# Patient Record
Sex: Female | Born: 1940 | Race: White | Hispanic: No | Smoking: Never smoker
Health system: Southern US, Community
[De-identification: ages and names within clinical notes are randomized; demographics above are authoritative.]

## PROBLEM LIST (undated history)

## (undated) DIAGNOSIS — E785 Hyperlipidemia, unspecified: Secondary | ICD-10-CM

## (undated) DIAGNOSIS — J189 Pneumonia, unspecified organism: Secondary | ICD-10-CM

## (undated) DIAGNOSIS — F419 Anxiety disorder, unspecified: Secondary | ICD-10-CM

## (undated) DIAGNOSIS — K5730 Diverticulosis of large intestine without perforation or abscess without bleeding: Secondary | ICD-10-CM

## (undated) DIAGNOSIS — E559 Vitamin D deficiency, unspecified: Secondary | ICD-10-CM

## (undated) DIAGNOSIS — K8020 Calculus of gallbladder without cholecystitis without obstruction: Secondary | ICD-10-CM

## (undated) HISTORY — PX: ABDOMINAL HYSTERECTOMY: SHX81

## (undated) HISTORY — DX: Calculus of gallbladder without cholecystitis without obstruction: K80.20

## (undated) HISTORY — DX: Pneumonia, unspecified organism: J18.9

## (undated) HISTORY — DX: Anxiety disorder, unspecified: F41.9

## (undated) HISTORY — DX: Diverticulosis of large intestine without perforation or abscess without bleeding: K57.30

## (undated) HISTORY — DX: Hyperlipidemia, unspecified: E78.5

## (undated) HISTORY — DX: Vitamin D deficiency, unspecified: E55.9

## (undated) HISTORY — PX: VESICOVAGINAL FISTULA CLOSURE W/ TAH: SUR271

## (undated) HISTORY — PX: CHOLECYSTECTOMY: SHX55

## (undated) HISTORY — PX: TONSILLECTOMY: SUR1361

## (undated) HISTORY — PX: APPENDECTOMY: SHX54

---

## 2000-05-23 ENCOUNTER — Encounter: Admission: RE | Admit: 2000-05-23 | Discharge: 2000-05-23 | Payer: Self-pay | Admitting: Endocrinology

## 2000-05-23 ENCOUNTER — Encounter: Payer: Self-pay | Admitting: Endocrinology

## 2002-07-10 ENCOUNTER — Encounter: Admission: RE | Admit: 2002-07-10 | Discharge: 2002-07-10 | Payer: Self-pay | Admitting: Endocrinology

## 2002-07-10 ENCOUNTER — Encounter: Payer: Self-pay | Admitting: Endocrinology

## 2003-04-30 ENCOUNTER — Ambulatory Visit (HOSPITAL_COMMUNITY): Admission: RE | Admit: 2003-04-30 | Discharge: 2003-04-30 | Payer: Self-pay | Admitting: Family Medicine

## 2003-05-03 ENCOUNTER — Ambulatory Visit (HOSPITAL_COMMUNITY): Admission: RE | Admit: 2003-05-03 | Discharge: 2003-05-03 | Payer: Self-pay | Admitting: Family Medicine

## 2003-05-03 ENCOUNTER — Encounter: Payer: Self-pay | Admitting: Family Medicine

## 2005-03-28 ENCOUNTER — Encounter: Admission: RE | Admit: 2005-03-28 | Discharge: 2005-03-28 | Payer: Self-pay | Admitting: Family Medicine

## 2006-04-02 ENCOUNTER — Encounter: Admission: RE | Admit: 2006-04-02 | Discharge: 2006-04-02 | Payer: Self-pay | Admitting: Family Medicine

## 2007-06-11 ENCOUNTER — Encounter: Admission: RE | Admit: 2007-06-11 | Discharge: 2007-06-11 | Payer: Self-pay | Admitting: Family Medicine

## 2008-07-06 ENCOUNTER — Encounter: Admission: RE | Admit: 2008-07-06 | Discharge: 2008-07-06 | Payer: Self-pay | Admitting: Family Medicine

## 2009-07-11 ENCOUNTER — Encounter: Admission: RE | Admit: 2009-07-11 | Discharge: 2009-07-11 | Payer: Self-pay | Admitting: Family Medicine

## 2010-09-11 ENCOUNTER — Encounter: Admission: RE | Admit: 2010-09-11 | Discharge: 2010-09-11 | Payer: Self-pay | Admitting: Family Medicine

## 2011-01-21 ENCOUNTER — Encounter: Payer: Self-pay | Admitting: Endocrinology

## 2011-03-01 DIAGNOSIS — R079 Chest pain, unspecified: Secondary | ICD-10-CM | POA: Insufficient documentation

## 2011-03-01 DIAGNOSIS — K5730 Diverticulosis of large intestine without perforation or abscess without bleeding: Secondary | ICD-10-CM | POA: Insufficient documentation

## 2011-03-02 ENCOUNTER — Encounter (INDEPENDENT_AMBULATORY_CARE_PROVIDER_SITE_OTHER): Payer: Medicare Other | Admitting: Physician Assistant

## 2011-03-02 ENCOUNTER — Encounter: Payer: Self-pay | Admitting: Physician Assistant

## 2011-03-02 ENCOUNTER — Encounter (INDEPENDENT_AMBULATORY_CARE_PROVIDER_SITE_OTHER): Payer: Self-pay | Admitting: *Deleted

## 2011-03-02 DIAGNOSIS — R9439 Abnormal result of other cardiovascular function study: Secondary | ICD-10-CM

## 2011-03-02 DIAGNOSIS — R079 Chest pain, unspecified: Secondary | ICD-10-CM

## 2011-03-08 NOTE — Letter (Signed)
Summary: Generic Letter  Architectural technologist, Main Office  [address]   [place], [place] [address]   Phone: [phone]  Fax: [phone]    03/02/2011         [facility] [address] [place], [place]  [address]  [place]  Dear Ms. Yeater,    We need to schedule you for a Stress test myoview within the next 2   weeks as per Tereso Newcomer, PA-C/[place] (DOD)today, pt's primary   Cardiologist is Dr. Laurene Footman.       Sincerely,   Danielle Rankin, CMA

## 2011-03-13 ENCOUNTER — Telehealth (HOSPITAL_COMMUNITY): Payer: Self-pay | Admitting: Radiology

## 2011-03-14 ENCOUNTER — Encounter (INDEPENDENT_AMBULATORY_CARE_PROVIDER_SITE_OTHER): Payer: Self-pay | Admitting: *Deleted

## 2011-03-14 ENCOUNTER — Encounter: Payer: Self-pay | Admitting: Cardiology

## 2011-03-14 ENCOUNTER — Encounter: Payer: Self-pay | Admitting: *Deleted

## 2011-03-14 ENCOUNTER — Ambulatory Visit (HOSPITAL_COMMUNITY): Payer: Medicare Other | Attending: Cardiology

## 2011-03-14 DIAGNOSIS — I4949 Other premature depolarization: Secondary | ICD-10-CM

## 2011-03-14 DIAGNOSIS — I491 Atrial premature depolarization: Secondary | ICD-10-CM

## 2011-03-14 DIAGNOSIS — R9430 Abnormal result of cardiovascular function study, unspecified: Secondary | ICD-10-CM

## 2011-03-14 DIAGNOSIS — R079 Chest pain, unspecified: Secondary | ICD-10-CM | POA: Insufficient documentation

## 2011-03-16 ENCOUNTER — Telehealth: Payer: Self-pay | Admitting: Physician Assistant

## 2011-03-20 NOTE — Progress Notes (Signed)
Summary: pt rtn carol's call  Phone Note Call from Patient   Caller: Patient [phone] Reason for Call: Talk to Nurse Summary of Call: pt rtn call to carol Initial call taken by: Glynda Jaeger,  March 16, 2011 11:11 AM  Follow-up for Phone Call        pt ware of results Danielle Rankin, CMA  March 16, 2011 5:12 PM

## 2011-03-20 NOTE — Assessment & Plan Note (Signed)
Summary: Cardiology Nuclear Testing  Nuclear Med Background Indications for Stress Test: Evaluation for Ischemia   History: GXT  History Comments: 03/02/11 GXT:BL   Symptoms: Chest Pain, Dizziness, Nausea  Symptoms Comments: Last episode of CP:3 months ago   Nuclear Pre-Procedure Cardiac Risk Factors: Family History - CAD, Lipids Caffeine/Decaff Intake: None Lungs: Clear IV 0.9% NS with Angio Cath: 20g     IV Site: R Antecubital IV Started by: Stanton Kidney, EMT-P Chest Size (in) 34     Cup Size C     Height (in): 59 Weight (lb): 114 BMI: 23.11  Nuclear Med Study 1 or 2 day study:  1 day     Stress Test Type:  Stress Reading MD:  Cassell Clement, MD     Referring MD:  Verne Carrow, MD Resting Radionuclide:  Technetium 49m Tetrofosmin     Resting Radionuclide Dose:  11 mCi  Stress Radionuclide:  Technetium 26m Tetrofosmin     Stress Radionuclide Dose:  33 mCi   Stress Protocol Exercise Time (min):  8:30 min     Max HR:  160 bpm     Predicted Max HR:  151 bpm  Max Systolic BP: 199 mm Hg     Percent Max HR:  105.96 %     METS: 10.1 Rate Pressure Product:  81191    Stress Test Technologist:  Rea [employer], CMA-N     Nuclear Technologist:  Domenic Polite, CNMT  Rest Procedure  Myocardial perfusion imaging was performed at rest 45 minutes following the intravenous administration of Technetium 53m Tetrofosmin.  Stress Procedure  The patient exercised for 8:30 on the treadmill utilizing the Bruce protocol.  The patient stopped due to fatigue and denied any chest pain.  There were no diagnostic ST-T wave changes, only occasional PAC's and PVC's.  Technetium 17m Tetrofosmin was injected at peak exercise and myocardial perfusion imaging was performed after a brief delay.  QPS Raw Data Images:  Normal; no motion artifact; normal heart/lung ratio. Stress Images:  Normal homogeneous uptake in all areas of the myocardium. Rest Images:  Normal homogeneous uptake in all areas of  the myocardium. Subtraction (SDS):  No evidence of ischemia. Transient Ischemic Dilatation:  .92  (Normal <1.22)  Lung/Heart Ratio:  .26  (Normal <0.45)  Quantitative Gated Spect Images QGS EDV:  43 ml QGS ESV:  9 ml QGS EF:  80 % QGS cine images:  No wall motion abnormalities  Findings Normal nuclear study      Overall Impression  Exercise Capacity: Good exercise capacity. BP Response: Normal blood pressure response. Clinical Symptoms: No chest pain ECG Impression: No significant ST segment change suggestive of ischemia. Overall Impression: Normal stress nuclear study.  Appended Document: Cardiology Nuclear Testing normal please send copy to PCP  Appended Document: Cardiology Nuclear Testing lmom for ptcb for nuc results.... Danielle Rankin, CMA  Appended Document: Cardiology Nuclear Testing Pt of Tereso Newcomer. Danielle Rankin has tried to call with results. cdm  Appended Document: Cardiology Nuclear Testing pt aware of nuc results and gave verbal understanding today....will fax over to Dr. Evlyn Kanner today as per pt.Marland KitchenMarland KitchenMarland KitchenDanielle Rankin. CMA

## 2011-03-20 NOTE — Progress Notes (Signed)
Summary: Nuclear Pre-Procedure  Phone Note Outgoing Call Call back at [facility] Phone [phone]   Call placed by: Stanton Kidney, EMT-P,  March 13, 2011 2:41 PM Call placed to: Patient Action Taken: Phone Call Completed Reason for Call: Confirm/change Appt Summary of Call: Reviewed information on Myoview Information Sheet (see scanned document for further details).  Spoke with the patient. Stanton Kidney, EMT-P  March 13, 2011 2:41 PM      Nuclear Med Background Indications for Stress Test: Evaluation for Ischemia   History: GXT  History Comments: 03/02/11 GXT BL with N/S ST changes  Symptoms: Chest Pain, Dizziness

## 2011-07-06 ENCOUNTER — Encounter: Payer: Self-pay | Admitting: Physician Assistant

## 2011-10-18 ENCOUNTER — Other Ambulatory Visit: Payer: Self-pay | Admitting: Endocrinology

## 2011-10-18 DIAGNOSIS — Z1231 Encounter for screening mammogram for malignant neoplasm of breast: Secondary | ICD-10-CM

## 2011-10-19 ENCOUNTER — Ambulatory Visit
Admission: RE | Admit: 2011-10-19 | Discharge: 2011-10-19 | Disposition: A | Discharge status: Home / Self Care | Payer: Medicare Other | Source: Ambulatory Visit | Attending: Endocrinology | Admitting: Endocrinology

## 2011-10-19 DIAGNOSIS — Z1231 Encounter for screening mammogram for malignant neoplasm of breast: Secondary | ICD-10-CM

## 2012-03-12 ENCOUNTER — Encounter (HOSPITAL_COMMUNITY): Payer: Medicare Other

## 2012-03-18 ENCOUNTER — Encounter (HOSPITAL_COMMUNITY)
Admission: RE | Admit: 2012-03-18 | Discharge: 2012-03-18 | Disposition: A | Discharge status: Home / Self Care | Payer: Medicare Other | Source: Ambulatory Visit | Attending: Endocrinology | Admitting: Endocrinology

## 2012-03-18 DIAGNOSIS — M810 Age-related osteoporosis without current pathological fracture: Secondary | ICD-10-CM | POA: Insufficient documentation

## 2012-03-18 MED ORDER — ZOLEDRONIC ACID 5 MG/100ML IV SOLN
5.0000 mg | Freq: Once | INTRAVENOUS | Status: AC
  Administered 2012-03-18: 5 mg via INTRAVENOUS
  Filled 2012-03-18: qty 100

## 2012-03-18 MED ORDER — SODIUM CHLORIDE 0.9 % IV SOLN
INTRAVENOUS | Status: AC
  Administered 2012-03-18: 250 mL via INTRAVENOUS

## 2012-03-18 NOTE — Discharge Instructions (Signed)
Drink fluids/water as tolerated over next 72hrs Tylenol or Ibuprofen OTC as directed Continue calcium and Vit D as directed by your MD 

## 2012-04-14 ENCOUNTER — Ambulatory Visit: Payer: Medicare Other | Attending: Family Medicine | Admitting: Physical Therapy

## 2012-04-14 DIAGNOSIS — M546 Pain in thoracic spine: Secondary | ICD-10-CM | POA: Insufficient documentation

## 2012-04-14 DIAGNOSIS — R5381 Other malaise: Secondary | ICD-10-CM | POA: Insufficient documentation

## 2012-04-14 DIAGNOSIS — IMO0001 Reserved for inherently not codable concepts without codable children: Secondary | ICD-10-CM | POA: Insufficient documentation

## 2012-04-16 ENCOUNTER — Ambulatory Visit: Payer: Medicare Other | Admitting: Physical Therapy

## 2012-04-18 ENCOUNTER — Ambulatory Visit: Payer: Medicare Other | Admitting: *Deleted

## 2012-04-21 ENCOUNTER — Ambulatory Visit: Payer: Medicare Other | Admitting: Physical Therapy

## 2012-04-25 ENCOUNTER — Ambulatory Visit: Payer: Medicare Other | Admitting: Physical Therapy

## 2012-05-12 ENCOUNTER — Ambulatory Visit: Payer: Medicare Other | Attending: Family Medicine | Admitting: Physical Therapy

## 2012-05-12 DIAGNOSIS — IMO0001 Reserved for inherently not codable concepts without codable children: Secondary | ICD-10-CM | POA: Insufficient documentation

## 2012-05-12 DIAGNOSIS — M546 Pain in thoracic spine: Secondary | ICD-10-CM | POA: Insufficient documentation

## 2012-05-12 DIAGNOSIS — R5381 Other malaise: Secondary | ICD-10-CM | POA: Insufficient documentation

## 2012-05-15 ENCOUNTER — Encounter: Payer: Medicare Other | Admitting: Physical Therapy

## 2012-10-09 ENCOUNTER — Other Ambulatory Visit: Payer: Self-pay | Admitting: Endocrinology

## 2012-10-09 DIAGNOSIS — Z1231 Encounter for screening mammogram for malignant neoplasm of breast: Secondary | ICD-10-CM

## 2012-11-17 ENCOUNTER — Ambulatory Visit
Admission: RE | Admit: 2012-11-17 | Discharge: 2012-11-17 | Disposition: A | Discharge status: Home / Self Care | Payer: Medicare Other | Source: Ambulatory Visit | Attending: Endocrinology | Admitting: Endocrinology

## 2012-11-17 DIAGNOSIS — Z1231 Encounter for screening mammogram for malignant neoplasm of breast: Secondary | ICD-10-CM

## 2013-06-17 ENCOUNTER — Encounter: Payer: Self-pay | Admitting: Gastroenterology

## 2013-10-27 ENCOUNTER — Other Ambulatory Visit: Payer: Self-pay

## 2013-10-27 DIAGNOSIS — Z1231 Encounter for screening mammogram for malignant neoplasm of breast: Secondary | ICD-10-CM

## 2013-11-24 ENCOUNTER — Ambulatory Visit
Admission: RE | Admit: 2013-11-24 | Discharge: 2013-11-24 | Disposition: A | Discharge status: Home / Self Care | Payer: Medicare Other | Source: Ambulatory Visit

## 2013-11-24 DIAGNOSIS — Z1231 Encounter for screening mammogram for malignant neoplasm of breast: Secondary | ICD-10-CM

## 2013-11-27 ENCOUNTER — Other Ambulatory Visit: Payer: Self-pay | Admitting: Endocrinology

## 2013-11-27 DIAGNOSIS — R928 Other abnormal and inconclusive findings on diagnostic imaging of breast: Secondary | ICD-10-CM

## 2013-12-14 ENCOUNTER — Ambulatory Visit
Admission: RE | Admit: 2013-12-14 | Discharge: 2013-12-14 | Disposition: A | Discharge status: Home / Self Care | Payer: Medicare Other | Source: Ambulatory Visit | Attending: Endocrinology | Admitting: Endocrinology

## 2013-12-14 DIAGNOSIS — R928 Other abnormal and inconclusive findings on diagnostic imaging of breast: Secondary | ICD-10-CM

## 2014-05-30 ENCOUNTER — Encounter: Payer: Self-pay | Admitting: Gastroenterology

## 2014-06-29 ENCOUNTER — Other Ambulatory Visit: Payer: Self-pay | Admitting: Endocrinology

## 2014-06-29 DIAGNOSIS — R1084 Generalized abdominal pain: Secondary | ICD-10-CM

## 2014-07-07 ENCOUNTER — Other Ambulatory Visit: Payer: Medicare Other

## 2015-03-28 ENCOUNTER — Other Ambulatory Visit: Payer: Self-pay | Admitting: Endocrinology

## 2015-03-28 ENCOUNTER — Other Ambulatory Visit: Payer: Self-pay

## 2015-03-28 DIAGNOSIS — Z1231 Encounter for screening mammogram for malignant neoplasm of breast: Secondary | ICD-10-CM

## 2015-03-29 ENCOUNTER — Emergency Department (HOSPITAL_COMMUNITY): Payer: Medicare Other

## 2015-03-29 ENCOUNTER — Emergency Department (HOSPITAL_COMMUNITY)
Admission: EM | Admit: 2015-03-29 | Discharge: 2015-03-29 | Disposition: A | Discharge status: Home / Self Care | Payer: Medicare Other | Attending: Emergency Medicine | Admitting: Emergency Medicine

## 2015-03-29 ENCOUNTER — Encounter (HOSPITAL_COMMUNITY): Payer: Self-pay | Admitting: Emergency Medicine

## 2015-03-29 DIAGNOSIS — W1839XA Other fall on same level, initial encounter: Secondary | ICD-10-CM | POA: Insufficient documentation

## 2015-03-29 DIAGNOSIS — Y92009 Unspecified place in unspecified non-institutional (private) residence as the place of occurrence of the external cause: Secondary | ICD-10-CM | POA: Insufficient documentation

## 2015-03-29 DIAGNOSIS — S52592A Other fractures of lower end of left radius, initial encounter for closed fracture: Secondary | ICD-10-CM | POA: Insufficient documentation

## 2015-03-29 DIAGNOSIS — Z8719 Personal history of other diseases of the digestive system: Secondary | ICD-10-CM | POA: Insufficient documentation

## 2015-03-29 DIAGNOSIS — S52612A Displaced fracture of left ulna styloid process, initial encounter for closed fracture: Secondary | ICD-10-CM | POA: Diagnosis not present

## 2015-03-29 DIAGNOSIS — Y9389 Activity, other specified: Secondary | ICD-10-CM | POA: Diagnosis not present

## 2015-03-29 DIAGNOSIS — Y998 Other external cause status: Secondary | ICD-10-CM | POA: Insufficient documentation

## 2015-03-29 DIAGNOSIS — S6992XA Unspecified injury of left wrist, hand and finger(s), initial encounter: Secondary | ICD-10-CM | POA: Diagnosis present

## 2015-03-29 DIAGNOSIS — S52502A Unspecified fracture of the lower end of left radius, initial encounter for closed fracture: Secondary | ICD-10-CM

## 2015-03-29 MED ORDER — TRAMADOL HCL 50 MG PO TABS: 50.0000 mg | ORAL_TABLET | Freq: Four times a day (QID) | ORAL | Status: DC | PRN

## 2015-03-29 NOTE — ED Provider Notes (Signed)
CSN: [id number]     Arrival date & time 03/29/15  1623 History  This chart was scribed for Davonna Belling, MD by Randa Evens, ED Scribe. This patient was seen in room APA06/APA06 and the patient's care was started at 5:47 PM.      Chief Complaint  Patient presents with  . Fall   Patient is a 74 y.o. female presenting with fall. The history is provided by the patient. No language interpreter was used.  Fall   HPI Comments: Megan Boyle is a 74 y.o. female who presents to the Emergency Department complaining of new fall from standing height onset today PTA. Pt states she tripped and fell while trying to help her neighbor. Pt reports left wrist pain with associated swelling. Pt states that the pain is worse with movement. Pt reports epistaxis that has resolved. Pt states that she did hit her head on concrete but denies LOC. Pt doesn't report any medications PTA. Pt states she did apply ice to the wrist. Pt denies neck pain or dental problem.   Past Medical History  Diagnosis Date  . Chest pain   . Diverticulosis of colon (without mention of hemorrhage)    Past Surgical History  Procedure Laterality Date  . Cholecystectomy    . Appendectomy    . Vesicovaginal fistula closure w/ tah    . Abdominal hysterectomy     Family History  Problem Relation Age of Onset  . Coronary artery disease Brother     also father - MI at 28    History  Substance Use Topics  . Smoking status: Never Smoker   . Smokeless tobacco: Not on file  . Alcohol Use: No   OB History    No data available      Review of Systems  HENT: Positive for nosebleeds. Negative for dental problem.   Musculoskeletal: Positive for joint swelling and arthralgias. Negative for neck pain.  Neurological: Negative for syncope.  All other systems reviewed and are negative.    Allergies  Penicillins  Home Medications   Prior to Admission medications   Medication Sig Start Date End Date Taking? Authorizing  Provider  aspirin 81 MG tablet Take 81 mg by mouth every Thursday.    Yes Historical Provider, MD  calcium carbonate (OS-CAL) 600 MG TABS Take 600 mg by mouth 2 (two) times daily with a meal.   Yes Historical Provider, MD  Multiple Vitamin (MULTIVITAMIN) capsule Take 1 capsule by mouth daily.   Yes Historical Provider, MD  rosuvastatin (CRESTOR) 10 MG tablet Take 10 mg by mouth every Monday.    Yes Historical Provider, MD  Vitamin D, Ergocalciferol, (DRISDOL) 50000 UNITS CAPS Take 50,000 Units by mouth once a week.   Yes Historical Provider, MD  ALPRAZolam Duanne Moron) 0.5 MG tablet Take 0.125-0.5 mg by mouth as needed for anxiety.     Historical Provider, MD  traMADol (ULTRAM) 50 MG tablet Take 1 tablet (50 mg total) by mouth every 6 (six) hours as needed. 03/29/15   Davonna Belling, MD  zoledronic acid (RECLAST) 5 MG/100ML SOLN Inject 5 mg into the vein once. Last dose 03/18/12    Historical Provider, MD   BP 157/76 mmHg  Pulse 79  Temp(Src) 98.8 F (37.1 C) (Oral)  Resp 18  Ht 4\' 9"  (1.448 m)  Wt 112 lb (50.803 kg)  BMI 24.23 kg/m2  SpO2 100%   Physical Exam  Constitutional: She is oriented to person, place, and time. She appears well-developed and  well-nourished. No distress.  HENT:  Head: Normocephalic.  Nose: No nasal septal hematoma.  Slight swelling over nose. Dried blood bilateral nares. no crepitus. no septal hematoma.   Eyes: Conjunctivae and EOM are normal.  Neck: Neck supple. No tracheal deviation present.  Cardiovascular: Normal rate.   Pulmonary/Chest: Effort normal. No respiratory distress.  Musculoskeletal: Normal range of motion.  No tenderness over shoulders elbows hips knees or ankles.   Swelling in left wrist on medial dorsal aspect. radial sensation and ulnar distrubution intact.   Neurological: She is alert and oriented to person, place, and time.  Skin: Skin is warm and dry.  Psychiatric: She has a normal mood and affect. Her behavior is normal.  Nursing note  and vitals reviewed.   ED Course  Procedures (including critical care time) Labs Review Labs Reviewed - No data to display  Imaging Review Dg Wrist Complete Left  03/29/2015   CLINICAL DATA:  Left wrist pain post fall at home, swelling and deformity  EXAM: LEFT WRIST - COMPLETE 3+ VIEW  COMPARISON:  None.  FINDINGS: Four views of the left wrist submitted. There is mild impacted comminuted fracture in distal left radial metaphysis. Fracture line is involving distal articular surface. Displaced fracture of ulnar styloid. Extensive degenerative changes first carpometacarpal joint.  IMPRESSION: Mild impacted fracture in distal left radius. Displaced fracture of left ulnar styloid.   Electronically Signed   By: Lahoma Crocker M.D.   On: 03/29/2015 16:55     EKG Interpretation None      MDM   Final diagnoses:  Distal radius fracture, left, closed, initial encounter    Patient with distal radius fracture. No other injury except for ulnar styloid fracture. Has seen Dr. Daylene Katayama in the past. Will follow-up with him.  I personally performed the services described in this documentation, which was scribed in my presence. The recorded information has been reviewed and is accurate.       Davonna Belling, MD 03/30/15 [phone]

## 2015-03-29 NOTE — ED Notes (Signed)
Pt presents post fall at home.  She was helping a Industrial/product designer and tripped over a rock and states that she "heard something pop." At first she worried that she had broken her nose since it was bleeding, but she soon realized that her left wrist is injured and possibly broken.  The left wrist is visibly deformed, swollen, and red.  She has had an ice pack on it since she left home.  She currently rates her pain as 5/10 and describes it as dull and achy.

## 2015-03-29 NOTE — Discharge Instructions (Signed)
Wrist Fracture A wrist fracture is a break or crack in one of the bones of your wrist. Your wrist is made up of eight small bones at the palm of your hand (carpal bones) and two long bones that make up your forearm (radius and ulna).  CAUSES   A direct blow to the wrist.  Falling on an outstretched hand.  Trauma, such as a car accident or a fall. RISK FACTORS Risk factors for wrist fracture include:   Participating in contact and high-risk sports, such as skiing, biking, and ice skating.  Taking steroid medicines.  Smoking.  Being female.  Being Caucasian.  Drinking more than three alcoholic beverages per day.  Having low or lowered bone density (osteoporosis or osteopenia).  Age. Older adults have decreased bone density.  Women who have had menopause.  History of previous fractures. SIGNS AND SYMPTOMS Symptoms of wrist fractures include tenderness, bruising, and inflammation. Additionally, the wrist may hang in an odd position or appear deformed.  DIAGNOSIS Diagnosis may include:  Physical exam.  X-ray. TREATMENT Treatment depends on many factors, including the nature and location of the fracture, your age, and your activity level. Treatment for wrist fracture can be nonsurgical or surgical.  Nonsurgical Treatment A plaster cast or splint may be applied to your wrist if the bone is in a good position. If the fracture is not in good position, it may be necessary for your health care provider to realign it before applying a splint or cast. Usually, a cast or splint will be worn for several weeks.  Surgical Treatment Sometimes the position of the bone is so far out of place that surgery is required to apply a device to hold it together as it heals. Depending on the fracture, there are a number of options for holding the bone in place while it heals, such as a cast and metal pins.  HOME CARE INSTRUCTIONS  Keep your injured wrist elevated and move your fingers as much as  possible.  Do not put pressure on any part of your cast or splint. It may break.   Use a plastic bag to protect your cast or splint from water while bathing or showering. Do not lower your cast or splint into water.  Take medicines only as directed by your health care provider.  Keep your cast or splint clean and dry. If it becomes wet, damaged, or suddenly feels too tight, contact your health care provider right away.  Do not use any tobacco products including cigarettes, chewing tobacco, or electronic cigarettes. Tobacco can delay bone healing. If you need help quitting, ask your health care provider.  Keep all follow-up visits as directed by your health care provider. This is important.  Ask your health care provider if you should take supplements of calcium and vitamins C and D to promote bone healing. SEEK MEDICAL CARE IF:   Your cast or splint is damaged, breaks, or gets wet.  You have a fever.  You have chills.  You have continued severe pain or more swelling than you did before the cast was put on. SEEK IMMEDIATE MEDICAL CARE IF:   Your hand or fingernails on the injured arm turn blue or gray, or feel cold or numb.  You have decreased feeling in the fingers of your injured arm. MAKE SURE YOU:  Understand these instructions.  Will watch your condition.  Will get help right away if you are not doing well or get worse. Document Released: 09/26/2005 Document Revised:   05/03/2014 Document Reviewed: 01/04/2012 ExitCare Patient Information 2015 [employer]. This information is not intended to replace advice given to you by your health care provider. Make sure you discuss any questions you have with your health care provider.  

## 2015-03-30 ENCOUNTER — Encounter (HOSPITAL_BASED_OUTPATIENT_CLINIC_OR_DEPARTMENT_OTHER): Payer: Self-pay | Admitting: *Deleted

## 2015-03-30 ENCOUNTER — Other Ambulatory Visit: Payer: Self-pay | Admitting: Orthopedic Surgery

## 2015-03-30 NOTE — Progress Notes (Signed)
Pt came in -no labs needed

## 2015-03-31 ENCOUNTER — Encounter (HOSPITAL_BASED_OUTPATIENT_CLINIC_OR_DEPARTMENT_OTHER)
Admission: RE | Disposition: A | Discharge status: Home / Self Care | Payer: Self-pay | Source: Ambulatory Visit | Attending: Orthopedic Surgery

## 2015-03-31 ENCOUNTER — Ambulatory Visit (HOSPITAL_BASED_OUTPATIENT_CLINIC_OR_DEPARTMENT_OTHER): Payer: Medicare Other | Admitting: Anesthesiology

## 2015-03-31 ENCOUNTER — Encounter (HOSPITAL_BASED_OUTPATIENT_CLINIC_OR_DEPARTMENT_OTHER): Payer: Self-pay | Admitting: Anesthesiology

## 2015-03-31 ENCOUNTER — Ambulatory Visit (HOSPITAL_BASED_OUTPATIENT_CLINIC_OR_DEPARTMENT_OTHER)
Admission: RE | Admit: 2015-03-31 | Discharge: 2015-03-31 | Disposition: A | Discharge status: Home / Self Care | Payer: Medicare Other | Source: Ambulatory Visit | Attending: Orthopedic Surgery | Admitting: Orthopedic Surgery

## 2015-03-31 DIAGNOSIS — Z79899 Other long term (current) drug therapy: Secondary | ICD-10-CM | POA: Insufficient documentation

## 2015-03-31 DIAGNOSIS — S52572A Other intraarticular fracture of lower end of left radius, initial encounter for closed fracture: Secondary | ICD-10-CM | POA: Insufficient documentation

## 2015-03-31 DIAGNOSIS — Y999 Unspecified external cause status: Secondary | ICD-10-CM | POA: Insufficient documentation

## 2015-03-31 DIAGNOSIS — Y9301 Activity, walking, marching and hiking: Secondary | ICD-10-CM | POA: Insufficient documentation

## 2015-03-31 DIAGNOSIS — W0110XA Fall on same level from slipping, tripping and stumbling with subsequent striking against unspecified object, initial encounter: Secondary | ICD-10-CM | POA: Insufficient documentation

## 2015-03-31 DIAGNOSIS — Y92008 Other place in unspecified non-institutional (private) residence as the place of occurrence of the external cause: Secondary | ICD-10-CM | POA: Diagnosis not present

## 2015-03-31 HISTORY — PX: OPEN REDUCTION INTERNAL FIXATION (ORIF) DISTAL RADIAL FRACTURE: SHX5989

## 2015-03-31 LAB — POCT HEMOGLOBIN-HEMACUE: Hemoglobin: 16.2 g/dL — ABNORMAL HIGH (ref 12.0–15.0)

## 2015-03-31 SURGERY — OPEN REDUCTION INTERNAL FIXATION (ORIF) DISTAL RADIUS FRACTURE
Anesthesia: General | Site: Wrist | Laterality: Left

## 2015-03-31 MED ORDER — VANCOMYCIN HCL IN DEXTROSE 1-5 GM/200ML-% IV SOLN
1000.0000 mg | INTRAVENOUS | Status: AC
  Administered 2015-03-31: 1000 mg via INTRAVENOUS

## 2015-03-31 MED ORDER — MIDAZOLAM HCL 2 MG/2ML IJ SOLN
1.0000 mg | INTRAMUSCULAR | Status: DC | PRN
  Administered 2015-03-31: 1 mg via INTRAVENOUS

## 2015-03-31 MED ORDER — PROPOFOL 10 MG/ML IV BOLUS
INTRAVENOUS | Status: DC | PRN
  Administered 2015-03-31: 150 mg via INTRAVENOUS

## 2015-03-31 MED ORDER — OXYCODONE HCL 5 MG PO TABS: 5.0000 mg | ORAL_TABLET | Freq: Once | ORAL | Status: DC | PRN

## 2015-03-31 MED ORDER — OXYCODONE HCL 5 MG/5ML PO SOLN
5.0000 mg | Freq: Once | ORAL | Status: DC | PRN
Start: 2015-03-31 — End: 2015-03-31

## 2015-03-31 MED ORDER — DEXAMETHASONE SODIUM PHOSPHATE 4 MG/ML IJ SOLN
INTRAMUSCULAR | Status: DC | PRN
  Administered 2015-03-31: 10 mg via INTRAVENOUS

## 2015-03-31 MED ORDER — CHLORHEXIDINE GLUCONATE 4 % EX LIQD: 60.0000 mL | Freq: Once | CUTANEOUS | Status: DC

## 2015-03-31 MED ORDER — HYDROMORPHONE HCL 1 MG/ML IJ SOLN: 0.2500 mg | INTRAMUSCULAR | Status: DC | PRN

## 2015-03-31 MED ORDER — BUPIVACAINE-EPINEPHRINE (PF) 0.5% -1:200000 IJ SOLN
INTRAMUSCULAR | Status: DC | PRN
  Administered 2015-03-31: 20 mL via PERINEURAL

## 2015-03-31 MED ORDER — MIDAZOLAM HCL 2 MG/2ML IJ SOLN
INTRAMUSCULAR | Status: AC
  Filled 2015-03-31: qty 2

## 2015-03-31 MED ORDER — LIDOCAINE HCL (CARDIAC) 20 MG/ML IV SOLN
INTRAVENOUS | Status: DC | PRN
  Administered 2015-03-31: 40 mg via INTRAVENOUS

## 2015-03-31 MED ORDER — FENTANYL CITRATE 0.05 MG/ML IJ SOLN
50.0000 ug | INTRAMUSCULAR | Status: DC | PRN
  Administered 2015-03-31: 100 ug via INTRAVENOUS

## 2015-03-31 MED ORDER — ONDANSETRON HCL 4 MG/2ML IJ SOLN
INTRAMUSCULAR | Status: DC | PRN
  Administered 2015-03-31: 4 mg via INTRAVENOUS

## 2015-03-31 MED ORDER — ONDANSETRON HCL 4 MG/2ML IJ SOLN: 4.0000 mg | Freq: Once | INTRAMUSCULAR | Status: DC | PRN

## 2015-03-31 MED ORDER — VANCOMYCIN HCL IN DEXTROSE 1-5 GM/200ML-% IV SOLN
INTRAVENOUS | Status: AC
  Filled 2015-03-31: qty 200

## 2015-03-31 MED ORDER — OXYCODONE-ACETAMINOPHEN 5-325 MG PO TABS: 1.0000 | ORAL_TABLET | ORAL | Status: DC | PRN

## 2015-03-31 MED ORDER — FENTANYL CITRATE 0.05 MG/ML IJ SOLN
INTRAMUSCULAR | Status: AC
  Filled 2015-03-31: qty 6

## 2015-03-31 MED ORDER — FENTANYL CITRATE 0.05 MG/ML IJ SOLN
INTRAMUSCULAR | Status: AC
  Filled 2015-03-31: qty 2

## 2015-03-31 MED ORDER — FENTANYL CITRATE 0.05 MG/ML IJ SOLN
INTRAMUSCULAR | Status: DC | PRN
  Administered 2015-03-31: 50 ug via INTRAVENOUS

## 2015-03-31 MED ORDER — LACTATED RINGERS IV SOLN
INTRAVENOUS | Status: DC
  Administered 2015-03-31 (×2): via INTRAVENOUS

## 2015-03-31 SURGICAL SUPPLY — 75 items
BIT DRILL 2.0 LNG QUCK RELEASE (BIT) IMPLANT
BIT DRILL 2.8X5 QR DISP (BIT) ×1 IMPLANT
BLADE MINI RND TIP GREEN BEAV (BLADE) ×1 IMPLANT
BLADE SURG 15 STRL LF DISP TIS (BLADE) ×1 IMPLANT
BLADE SURG 15 STRL SS (BLADE) ×2
BNDG CMPR 9X4 STRL LF SNTH (GAUZE/BANDAGES/DRESSINGS) ×1
BNDG COHESIVE 3X5 TAN STRL LF (GAUZE/BANDAGES/DRESSINGS) ×2 IMPLANT
BNDG ESMARK 4X9 LF (GAUZE/BANDAGES/DRESSINGS) ×2 IMPLANT
BNDG GAUZE ELAST 4 BULKY (GAUZE/BANDAGES/DRESSINGS) ×2 IMPLANT
CHLORAPREP W/TINT 26ML (MISCELLANEOUS) ×2 IMPLANT
CORDS BIPOLAR (ELECTRODE) ×2 IMPLANT
COVER BACK TABLE 60X90IN (DRAPES) ×2 IMPLANT
COVER MAYO STAND STRL (DRAPES) ×2 IMPLANT
CUFF TOURNIQUET SINGLE 18IN (TOURNIQUET CUFF) ×1 IMPLANT
DECANTER SPIKE VIAL GLASS SM (MISCELLANEOUS) IMPLANT
DRAPE EXTREMITY T 121X128X90 (DRAPE) ×2 IMPLANT
DRAPE OEC MINIVIEW 54X84 (DRAPES) ×2 IMPLANT
DRAPE SURG 17X23 STRL (DRAPES) ×2 IMPLANT
DRILL 2.0 LNG QUICK RELEASE (BIT) ×2
DRSG KUZMA FLUFF (GAUZE/BANDAGES/DRESSINGS) IMPLANT
ELECT REM PT RETURN 9FT ADLT (ELECTROSURGICAL)
ELECTRODE REM PT RTRN 9FT ADLT (ELECTROSURGICAL) IMPLANT
GAUZE SPONGE 4X4 12PLY STRL (GAUZE/BANDAGES/DRESSINGS) ×2 IMPLANT
GAUZE XEROFORM 1X8 LF (GAUZE/BANDAGES/DRESSINGS) ×2 IMPLANT
GLOVE BIO SURGEON STRL SZ7.5 (GLOVE) ×1 IMPLANT
GLOVE BIOGEL PI IND STRL 7.0 (GLOVE) IMPLANT
GLOVE BIOGEL PI IND STRL 8 (GLOVE) IMPLANT
GLOVE BIOGEL PI IND STRL 8.5 (GLOVE) ×1 IMPLANT
GLOVE BIOGEL PI INDICATOR 7.0 (GLOVE) ×1
GLOVE BIOGEL PI INDICATOR 8 (GLOVE) ×2
GLOVE BIOGEL PI INDICATOR 8.5 (GLOVE) ×1
GLOVE ECLIPSE 6.5 STRL STRAW (GLOVE) ×1 IMPLANT
GLOVE SURG ORTHO 8.0 STRL STRW (GLOVE) ×2 IMPLANT
GLOVE SURG SS PI 8.0 STRL IVOR (GLOVE) ×1 IMPLANT
GOWN STRL REUS W/ TWL LRG LVL3 (GOWN DISPOSABLE) ×1 IMPLANT
GOWN STRL REUS W/TWL LRG LVL3 (GOWN DISPOSABLE) ×2
GOWN STRL REUS W/TWL XL LVL3 (GOWN DISPOSABLE) ×3 IMPLANT
GUIDEWIRE ORTHO 0.054X6 (WIRE) ×3 IMPLANT
K-WIRE .045X4 (WIRE) IMPLANT
NDL PRECISIONGLIDE 27X1.5 (NEEDLE) IMPLANT
NEEDLE PRECISIONGLIDE 27X1.5 (NEEDLE) IMPLANT
NS IRRIG 1000ML POUR BTL (IV SOLUTION) ×2 IMPLANT
PACK BASIN DAY SURGERY FS (CUSTOM PROCEDURE TRAY) ×2 IMPLANT
PAD CAST 3X4 CTTN HI CHSV (CAST SUPPLIES) ×1 IMPLANT
PADDING CAST ABS 3INX4YD NS (CAST SUPPLIES)
PADDING CAST ABS 4INX4YD NS (CAST SUPPLIES)
PADDING CAST ABS COTTON 3X4 (CAST SUPPLIES) IMPLANT
PADDING CAST ABS COTTON 4X4 ST (CAST SUPPLIES) ×1 IMPLANT
PADDING CAST COTTON 3X4 STRL (CAST SUPPLIES) ×4
PENCIL BUTTON HOLSTER BLD 10FT (ELECTRODE) IMPLANT
PLATE LEFT DIST RADIUS NARROW (Plate) ×1 IMPLANT
SCREW ACTK 2 NL HEX 3.5.11 (Screw) ×1 IMPLANT
SCREW BN FT 16X2.3XLCK HEX CRT (Screw) IMPLANT
SCREW BONE HEX N/L 3.5X9 (Screw) IMPLANT
SCREW BONE HEX N/L 3.5X9MM (Screw) ×2 IMPLANT
SCREW CORT FT 18X2.3XLCK HEX (Screw) IMPLANT
SCREW CORTICAL LOCKING 2.3X16M (Screw) ×10 IMPLANT
SCREW CORTICAL LOCKING 2.3X18M (Screw) ×2 IMPLANT
SCREW FX16X2.3XLCK SMTH NS CRT (Screw) IMPLANT
SCREW NONLOCK HEX 3.5X12 (Screw) ×1 IMPLANT
SLEEVE SCD COMPRESS KNEE MED (MISCELLANEOUS) ×2 IMPLANT
SLING ARM MED ADULT FOAM STRAP (SOFTGOODS) ×1 IMPLANT
SPLINT PLASTER CAST XFAST 3X15 (CAST SUPPLIES) IMPLANT
SPLINT PLASTER XTRA FASTSET 3X (CAST SUPPLIES) ×10
STOCKINETTE 4X48 STRL (DRAPES) ×2 IMPLANT
SUT VIC AB 0 CT1 27 (SUTURE)
SUT VIC AB 0 CT1 27XBRD ANBCTR (SUTURE) IMPLANT
SUT VIC AB 2-0 SH 27 (SUTURE)
SUT VIC AB 2-0 SH 27XBRD (SUTURE) IMPLANT
SUT VIC AB 3-0 FS2 27 (SUTURE) IMPLANT
SUT VICRYL 4-0 PS2 18IN ABS (SUTURE) ×2 IMPLANT
SYR BULB 3OZ (MISCELLANEOUS) ×2 IMPLANT
SYR CONTROL 10ML LL (SYRINGE) IMPLANT
TOWEL OR 17X24 6PK STRL BLUE (TOWEL DISPOSABLE) ×2 IMPLANT
UNDERPAD 30X30 INCONTINENT (UNDERPADS AND DIAPERS) ×2 IMPLANT

## 2015-03-31 NOTE — Anesthesia Procedure Notes (Addendum)
Anesthesia Regional Block:  Supraclavicular block  Pre-Anesthetic Checklist: ,, timeout performed, Correct Patient, Correct Site, Correct Laterality, Correct Procedure, Correct Position, site marked, Risks and benefits discussed,  Surgical consent,  Pre-op evaluation,  At surgeon's request and post-op pain management  Laterality: Left and Upper  Prep: chloraprep       Needles:  Injection technique: Single-shot  Needle Type: Echogenic Stimulator Needle     Needle Length: 5cm 5 cm Needle Gauge: 21 and 21 G    Additional Needles:  Procedures: ultrasound guided (picture in chart) Supraclavicular block Narrative:  Start time: 03/31/2015 11:35 AM End time: 03/31/2015 11:41 AM Injection made incrementally with aspirations every 5 mL.  Performed by: Personally  Anesthesiologist: CREWS, DAVID   Procedure Name: LMA Insertion Date/Time: 03/31/2015 12:47 PM Performed by: Marrianne Mood Pre-anesthesia Checklist: Patient identified, Emergency Drugs available, Suction available, Patient being monitored and Timeout performed Patient Re-evaluated:Patient Re-evaluated prior to inductionOxygen Delivery Method: Circle System Utilized Preoxygenation: Pre-oxygenation with 100% oxygen Intubation Type: IV induction Ventilation: Mask ventilation without difficulty LMA: LMA inserted LMA Size: 4.0 Number of attempts: 1 Airway Equipment and Method: Bite block Placement Confirmation: positive ETCO2 Tube secured with: Tape Dental Injury: Teeth and Oropharynx as per pre-operative assessment  Comments: LMA inserted with meticulous care around facial bruising and abrasions.  Tape judiciously applied away from injured areas.    Patient stated pre-op that her teeth were sore, but intact without cracks/chips/loosening.  Lips, mouth, and left facial injuries evident.  Left eye orbital socket bruised and blackened.

## 2015-03-31 NOTE — Anesthesia Preprocedure Evaluation (Signed)
Anesthesia Evaluation  Patient identified by MRN, date of birth, ID band Patient awake    Reviewed: Allergy & Precautions, NPO status , Patient's Chart, lab work & pertinent test results  Airway Mallampati: I  TM Distance: >3 FB Neck ROM: Full    Dental  (+) Teeth Intact, Dental Advisory Given   Pulmonary  breath sounds clear to auscultation        Cardiovascular Rhythm:Regular Rate:Normal     Neuro/Psych    GI/Hepatic   Endo/Other    Renal/GU      Musculoskeletal   Abdominal   Peds  Hematology   Anesthesia Other Findings   Reproductive/Obstetrics                             Anesthesia Physical Anesthesia Plan  ASA: II  Anesthesia Plan: General   Post-op Pain Management:    Induction: Intravenous  Airway Management Planned: LMA  Additional Equipment:   Intra-op Plan:   Post-operative Plan: Extubation in OR  Informed Consent: I have reviewed the patients History and Physical, chart, labs and discussed the procedure including the risks, benefits and alternatives for the proposed anesthesia with the patient or authorized representative who has indicated his/her understanding and acceptance.   Dental advisory given  Plan Discussed with: CRNA, Anesthesiologist and Surgeon  Anesthesia Plan Comments:         Anesthesia Quick Evaluation

## 2015-03-31 NOTE — H&P (Signed)
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  She fell hitting her face and left wrist with an outstretched hand.  She complained of deformity of her wrist and was seen at [facility], taken by a friend, where x-rays revealed a comminuted intraarticular fracture of her left distal radius.  She called for referral with possibility of the necessity of treatment of this.  She had no loss of consciousness following the injury.  She has no prior history of injuries.  She complains of  a moderate aching pain with a feeling of swelling, she was placed in a splint and referred.   ALLERGIES:    Penicillin.  MEDICATIONS:     Crestor, vitamin D, multivitamins and calcium.  SURGICAL HISTORY:     Tonsillectomy (1966), hysterectomy (1989)  FAMILY MEDICAL HISTORY:   Positive for myocardial infarction with history of heart disease and arthritis.  SOCIAL HISTORY:      She is a nonsmoker and does not use alcoholic beverages.    REVIEW OF SYSTEMS:    Negative 14 points. Megan Boyle is an 74 y.o. female.   Chief Complaint: Fracture distal radius left wrist HPI: see above  Past Medical History  Diagnosis Date  . Chest pain   . Diverticulosis of colon (without mention of hemorrhage)     Past Surgical History  Procedure Laterality Date  . Cholecystectomy    . Appendectomy    . Vesicovaginal fistula closure w/ tah    . Abdominal hysterectomy      Family History  Problem Relation Age of Onset  . Coronary artery disease Brother     also father - MI at 47    Social History:  reports that she has never smoked. She does not have any smokeless tobacco history on file. She reports that she does not drink alcohol. Her drug history is not on file.  Allergies:  Allergies  Allergen Reactions  . Penicillins Hives    No prescriptions prior to admission    No results found for this or any previous  visit (from the past 51 hour(s)).  Dg Wrist Complete Left  03/29/2015   CLINICAL DATA:  Left wrist pain post fall at home, swelling and deformity  EXAM: LEFT WRIST - COMPLETE 3+ VIEW  COMPARISON:  None.  FINDINGS: Four views of the left wrist submitted. There is mild impacted comminuted fracture in distal left radial metaphysis. Fracture line is involving distal articular surface. Displaced fracture of ulnar styloid. Extensive degenerative changes first carpometacarpal joint.  IMPRESSION: Mild impacted fracture in distal left radius. Displaced fracture of left ulnar styloid.   Electronically Signed   By: Lahoma Crocker M.D.   On: 03/29/2015 16:55     Pertinent items are noted in HPI.  Height 4\' 10"  (1.473 m), weight 50.803 kg (112 lb).  General appearance: alert, cooperative and appears stated age Head: Normocephalic, without obvious abnormality Neck: no JVD Resp: clear to auscultation bilaterally Cardio: regular rate and rhythm, S1, S2 normal, no murmur, click, rub or gallop GI: soft, non-tender; bowel sounds normal; no masses,  no organomegaly Extremities: deformity left wrist Pulses: 2+ and symmetric Skin: Skin color, texture, turgor normal. No rashes or lesions Neurologic: Grossly normal Incision/Wound: na  Assessment/Plan RADIOGRAPHS:    X-rays show comminuted intraarticular distal radius fracture with significant displacement.   RECOMMENDATIONS/PLAN:    Plan is for ORIF, volar plate fixation  with possible bone graft.  This will be scheduled as an outpatient. She is aware there is no guarantee with the surgery, possibility of infection, recurrence, injury to arteries, nerves, tendons, incomplete relief of symptoms and dystrophy, the possibility of nonunion, possibility of the plate cutting out, the possibility of tendon ruptures, the possibility of necessitating removal of the plate.  She is in agreement.  She is scheduled for ORIF of left distal radius fracture as an outpatient under  anesthesia choice.  Vishaal Strollo R 03/31/2015, 9:20 AM

## 2015-03-31 NOTE — Anesthesia Postprocedure Evaluation (Signed)
Anesthesia Post Note  Patient: Megan Boyle  Procedure(s) Performed: Procedure(s) (LRB): OPEN REDUCTION INTERNAL FIXATION (ORIF) LEFT DISTAL RADIAL FRACTURE (Left)  Anesthesia type: General  Patient location: PACU  Post pain: Pain level controlled and Adequate analgesia  Post assessment: Post-op Vital signs reviewed, Patient's Cardiovascular Status Stable, Respiratory Function Stable, Patent Airway and Pain level controlled  Last Vitals:  Filed Vitals:   03/31/15 1445  BP: 127/69  Pulse: 77  Temp:   Resp: 11    Post vital signs: Reviewed and stable  Level of consciousness: awake, alert  and oriented  Complications: No apparent anesthesia complications

## 2015-03-31 NOTE — Brief Op Note (Signed)
03/31/2015  1:49 PM  PATIENT:  Megan Boyle  74 y.o. female  PRE-OPERATIVE DIAGNOSIS:  DISPLACED COMMINUTED LEFT DISTAL RADIUS FRACTURE  POST-OPERATIVE DIAGNOSIS:  Left Distal Radius Fracture  PROCEDURE:  Procedure(s): OPEN REDUCTION INTERNAL FIXATION (ORIF) LEFT DISTAL RADIAL FRACTURE (Left)  SURGEON:  Surgeon(s) and Role:    * Daryll Brod, MD - Primary    * Leanora Cover, MD - Assisting  PHYSICIAN ASSISTANT:   ASSISTANTS: K Sadler Teschner,MD  ANESTHESIA:   regional and general  EBL:  Total I/O In: 1300 [I.V.:1300] Out: -   BLOOD ADMINISTERED:none  DRAINS: none   LOCAL MEDICATIONS USED:  NONE  SPECIMEN:  No Specimen  DISPOSITION OF SPECIMEN:  N/A  COUNTS:  YES  TOURNIQUET:   Total Tourniquet Time Documented: Upper Arm (Left) - 45 minutes Total: Upper Arm (Left) - 45 minutes   DICTATION: .Other Dictation: Dictation Number [id number]  PLAN OF CARE: Discharge to home after PACU  PATIENT DISPOSITION:  PACU - hemodynamically stable.

## 2015-03-31 NOTE — Transfer of Care (Signed)
Immediate Anesthesia Transfer of Care Note  Patient: Megan Boyle  Procedure(s) Performed: Procedure(s): OPEN REDUCTION INTERNAL FIXATION (ORIF) LEFT DISTAL RADIAL FRACTURE (Left)  Patient Location: PACU  Anesthesia Type:GA combined with regional for post-op pain  Level of Consciousness: awake and patient cooperative  Airway & Oxygen Therapy: Patient Spontanous Breathing and Patient connected to face mask oxygen  Post-op Assessment: Report given to RN and Post -op Vital signs reviewed and stable  Post vital signs: Reviewed and stable  Last Vitals:  Filed Vitals:   03/31/15 1150  BP:   Pulse: 94  Temp:   Resp: 16    Complications: No apparent anesthesia complications

## 2015-03-31 NOTE — Discharge Instructions (Addendum)

## 2015-03-31 NOTE — Progress Notes (Signed)
Assisted Dr. Al Corpus with interscalene  block. Side rails up, monitors on throughout procedure. See vital signs in flow sheet. Tolerated Procedure well.

## 2015-03-31 NOTE — Op Note (Signed)
Dictation Number [id number] Intra-operative fluoroscopic images in the AP, lateral, and oblique views were taken and evaluated by myself.  Reduction and hardware placement were confirmed.  There was no intraarticular penetration of permanent hardware.

## 2015-04-01 NOTE — Op Note (Signed)
Megan, Boyle NO.:  [id number]  MEDICAL RECORD NO.:  [id number]  LOCATION:                                 FACILITY:  PHYSICIAN:  Daryll Brod, M.D.       DATE OF BIRTH:  30-Oct-1941  DATE OF PROCEDURE:  03/31/2015 DATE OF DISCHARGE:                              OPERATIVE REPORT   PREOPERATIVE DIAGNOSIS:  Comminuted intra-articular fracture, left distal radius.  POSTOPERATIVE DIAGNOSIS:  Comminuted intra-articular fracture, left distal radius.  OPERATION:  Open reduction and internal fixation with volar Acumed distal radius plate.  SURGEON:  Daryll Brod, MD  ASSISTANT:  Leanora Cover, MD  ANESTHESIA:  Supraclavicular block general.  ANESTHESIOLOGIST:  Albertha Ghee, MD  HISTORY:  The patient is a 74 year old female, who suffered a fall 2 days ago suffering a fracture over the left distal radius.  She was seen in the emergency room, placed in a splint, referred.  She is admitted for open reduction and internal fixation for comminuted intra-articular distal radius fracture.  Pre, peri, and postoperative course have been discussed along with risks and complications.  She is aware that there is no guarantee with the surgery, possibility of infection; recurrence of injury to arteries, nerves, tendons, incomplete relief of symptoms, dystrophy, possibility of tendon rupture, the possibility of nonunion, malunion, the possibility of cutting through the volar plate through the distal radius secondary to osteoporosis.  In the preoperative area, the patient was seen, the extremity marked by both patient and surgeon.  PROCEDURE IN DETAIL:  The patient was brought to the operating room, where a supraclavicular block had been carried out in the preoperative area.  General anesthetic was then given.  She was prepped using ChloraPrep, supine position, left arm free.  A 3-minute dry time was allowed.  Time-out taken, confirming patient and procedure.  The limb was  exsanguinated with an Esmarch bandage.  Tourniquet placed high on the arm was inflated to 250 mmHg.  A volar incision was made, carried to the radial styloid and towards the palm in line with the flexor carpi radialis tendon.  This was carried down through subcutaneous tissue. Bleeders were electrocauterized with bipolar, incision made through the flexor carpi radialis tendon sheath.  The pronator quadratus was identified.  The flexor pollicis longus was elevated slightly off the distal radius.  An incision made on the radial aspect of the pronator quadratus.  This was elevated off the distal radius revealing the fracture distally.  The brachioradialis was identified.  Protection made to the radial artery and the first dorsal extensor compartment tendons. The brachioradialis was then tenotomized.  A reduction was then performed with manipulation using a lobster claw, this allowed reduction of the fracture.  A narrow volar plate was then selected.  This was then pinned in position.  X-rays confirmed its position, this was then fixed proximally with the 12 mm screw.  Distal pins were then placed.  These were locking nonthreaded pins on the ulnar side measuring 16 mm, and 18 mm screw and a 16-mm screw were placed in the radial styloid.  X-rays confirmed positioning of the screw proximally in the gliding position, was slightly  long at 12 mm.  Two drill holes were placed for the remaining proximal screws.  A 9, 11 and 12 mm screws were then selected, the 9 placed, the 12 mm screw in the gliding hole placed distally and the 11 mm screw placed in the gliding hole.  AP lateral oblique x-rays revealed that there is no penetration of the joint that the fracture was reduced, aligned in good position with a fixation of the plate.  Wound was copiously irrigated with saline.  The pronator quadratus was repaired as much as possible with figure-of-eight 4-0 Vicryl sutures, subcutaneous tissue with 4-0  Vicryl, after irrigation and the skin with interrupted 4-0 nylon sutures.  Sterile compressive dressing, volar splint applied.  On deflation of the tourniquet, all fingers immediately pinked.  She was taken to the recovery room for observation in satisfactory condition.  She will be discharged home to return to the [facility] in 1 week on Percocet.          ______________________________ Daryll Brod, M.D.     GK/MEDQ  D:  03/31/2015  T:  04/01/2015  Job:  [id number]

## 2015-04-14 ENCOUNTER — Ambulatory Visit: Payer: Self-pay

## 2015-05-24 ENCOUNTER — Ambulatory Visit
Admission: RE | Admit: 2015-05-24 | Discharge: 2015-05-24 | Disposition: A | Discharge status: Home / Self Care | Payer: Medicare Other | Source: Ambulatory Visit | Attending: Endocrinology | Admitting: Endocrinology

## 2015-05-24 DIAGNOSIS — Z1231 Encounter for screening mammogram for malignant neoplasm of breast: Secondary | ICD-10-CM

## 2016-04-03 IMAGING — DX DG WRIST COMPLETE 3+V*L*
4 series · 4 of 4 positions shown · non-contrast
Comparison: None.

CLINICAL DATA: Left wrist pain post fall at home, swelling and
deformity

EXAM:
LEFT WRIST - COMPLETE 3+ VIEW

[wrist pa]
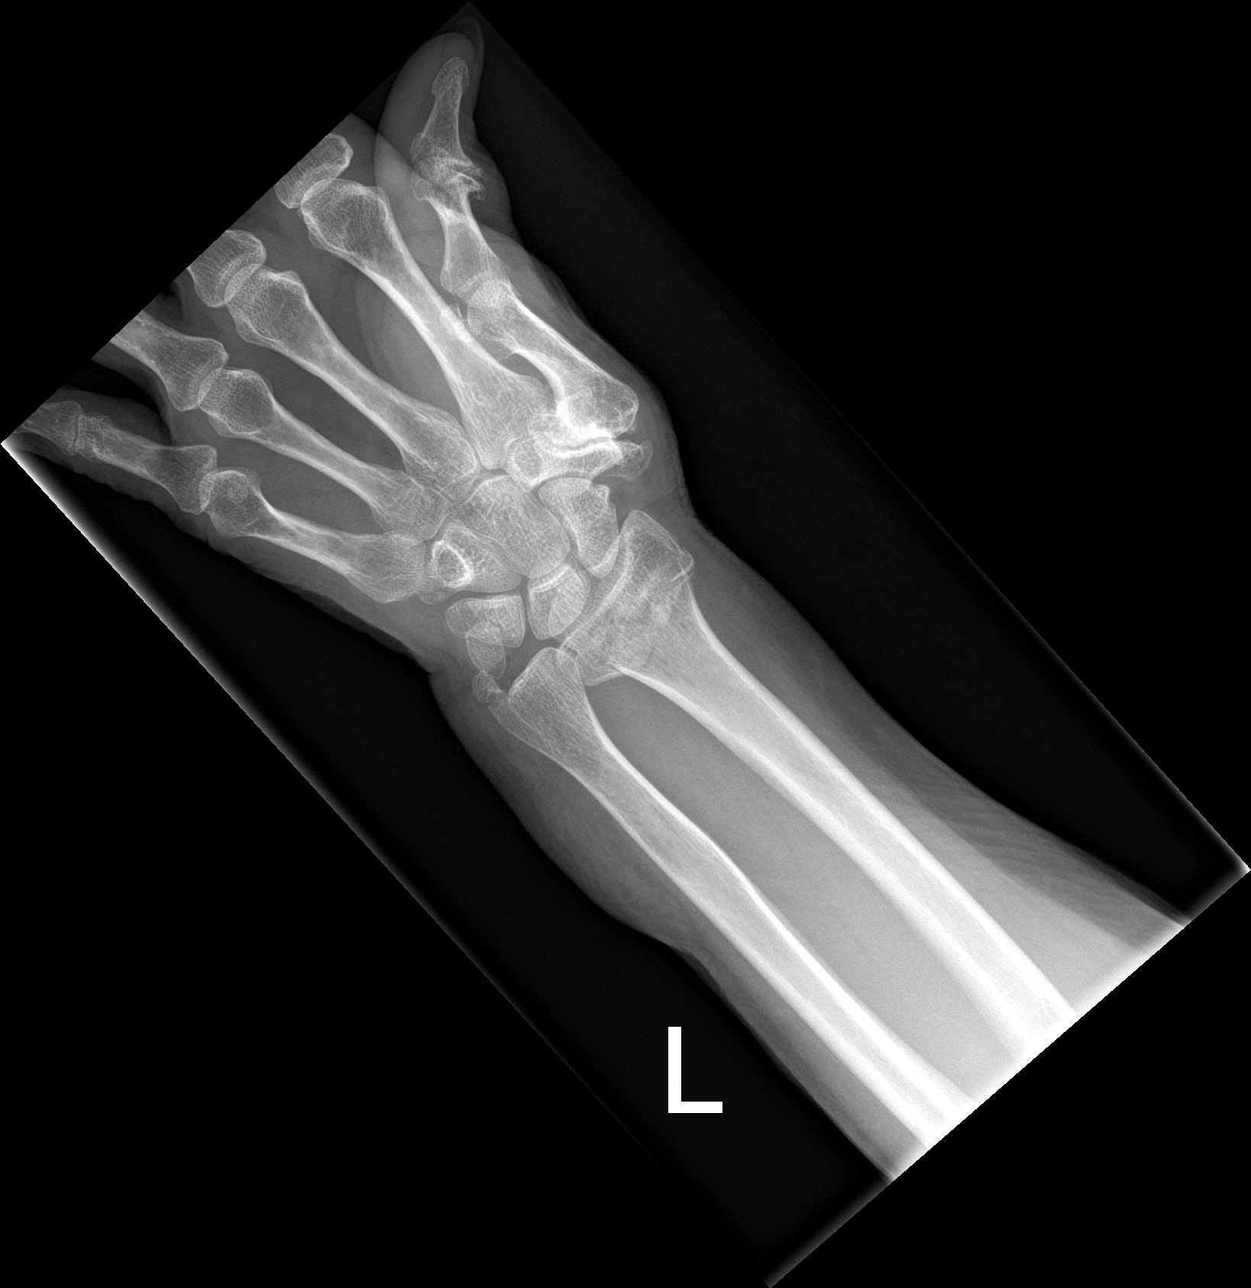

[wrist obl]
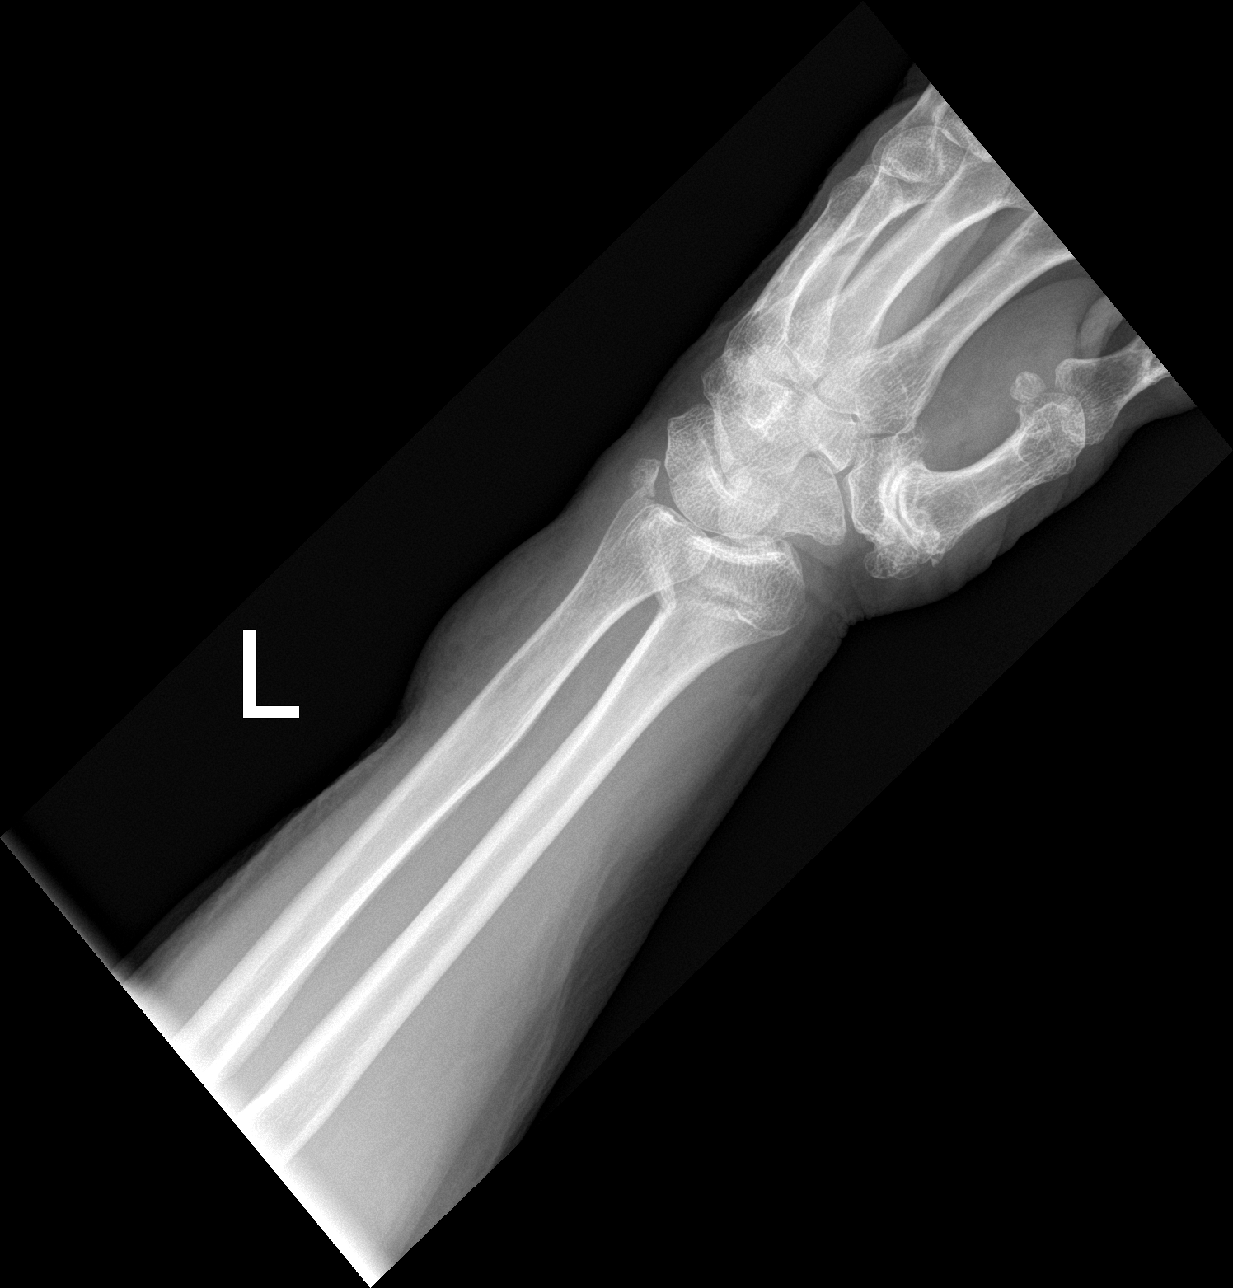

[wrist lat]
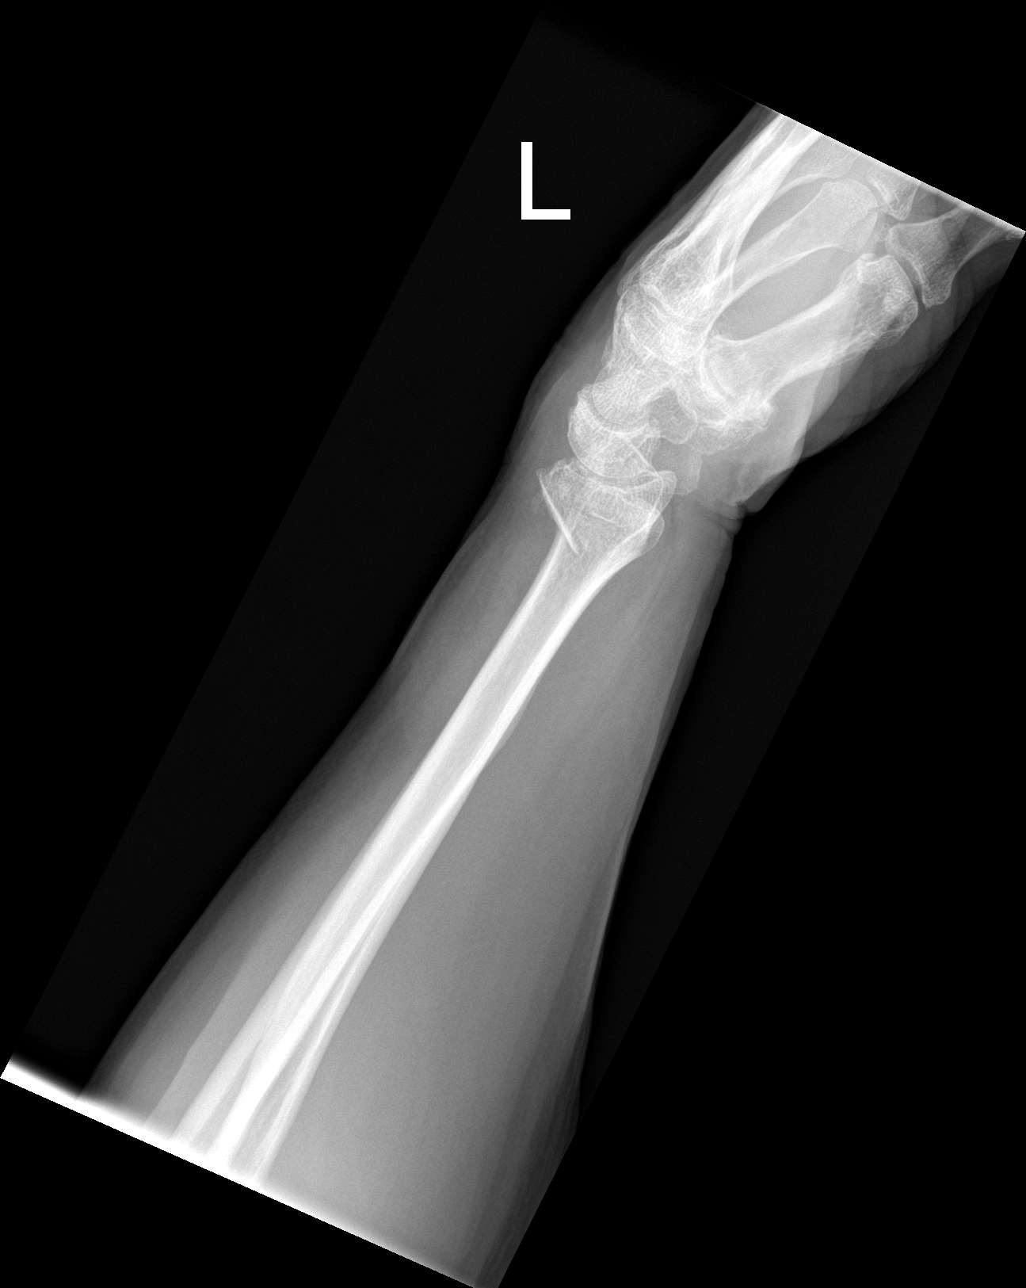

[wrist navicular]
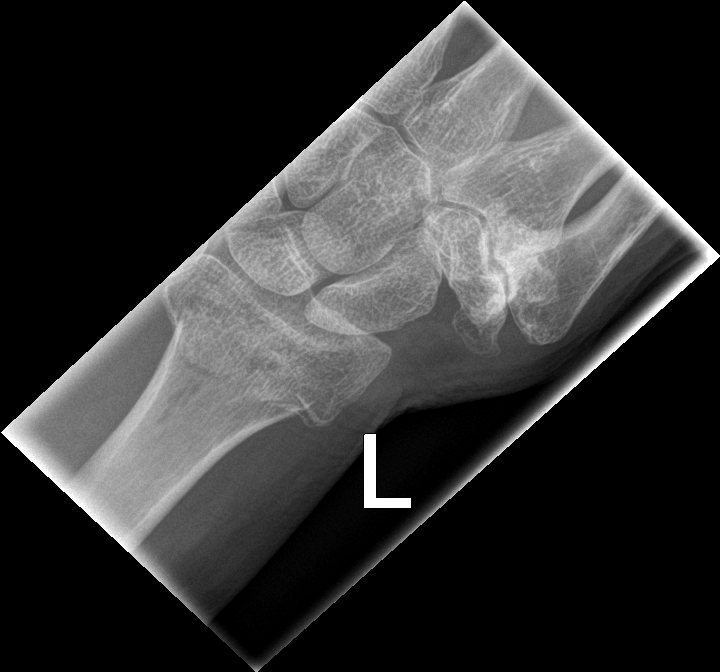

[4 of 4 positions shown; findings below may reference images not displayed]

FINDINGS: Four views of the left wrist submitted. There is mild impacted
comminuted fracture in distal left radial metaphysis. Fracture line
is involving distal articular surface. Displaced fracture of ulnar
styloid. Extensive degenerative changes first carpometacarpal joint.
IMPRESSION: Mild impacted fracture in distal left radius. Displaced fracture of
left ulnar styloid.

## 2016-04-26 ENCOUNTER — Encounter (HOSPITAL_COMMUNITY): Payer: Medicare Other

## 2016-06-15 ENCOUNTER — Other Ambulatory Visit (HOSPITAL_COMMUNITY): Payer: Self-pay | Admitting: *Deleted

## 2016-06-15 ENCOUNTER — Ambulatory Visit (HOSPITAL_COMMUNITY)
Admission: RE | Admit: 2016-06-15 | Discharge: 2016-06-15 | Disposition: A | Discharge status: Home / Self Care | Payer: Medicare Other | Source: Ambulatory Visit | Attending: Endocrinology | Admitting: Endocrinology

## 2016-06-15 DIAGNOSIS — M810 Age-related osteoporosis without current pathological fracture: Secondary | ICD-10-CM | POA: Insufficient documentation

## 2016-06-15 MED ORDER — DENOSUMAB 60 MG/ML ~~LOC~~ SOLN
60.0000 mg | Freq: Once | SUBCUTANEOUS | Status: AC
  Administered 2016-06-15: 60 mg via SUBCUTANEOUS
  Filled 2016-06-15: qty 1

## 2016-06-15 NOTE — Discharge Instructions (Signed)
Denosumab injection  What is this medicine?  DENOSUMAB (den oh sue mab) slows bone breakdown. Prolia is used to treat osteoporosis in women after menopause and in men. Xgeva is used to prevent bone fractures and other bone problems caused by cancer bone metastases. Xgeva is also used to treat giant cell tumor of the bone.  This medicine may be used for other purposes; ask your health care provider or pharmacist if you have questions.  What should I tell my health care provider before I take this medicine?  They need to know if you have any of these conditions:  -dental disease  -eczema  -infection or history of infections  -kidney disease or on dialysis  -low blood calcium or vitamin D  -malabsorption syndrome  -scheduled to have surgery or tooth extraction  -taking medicine that contains denosumab  -thyroid or parathyroid disease  -an unusual reaction to denosumab, other medicines, foods, dyes, or preservatives  -pregnant or trying to get pregnant  -breast-feeding  How should I use this medicine?  This medicine is for injection under the skin. It is given by a health care professional in a hospital or clinic setting.  If you are getting Prolia, a special MedGuide will be given to you by the pharmacist with each prescription and refill. Be sure to read this information carefully each time.  For Prolia, talk to your pediatrician regarding the use of this medicine in children. Special care may be needed. For Xgeva, talk to your pediatrician regarding the use of this medicine in children. While this drug may be prescribed for children as young as 13 years for selected conditions, precautions do apply.  Overdosage: If you think you have taken too much of this medicine contact a poison control center or emergency room at once.  NOTE: This medicine is only for you. Do not share this medicine with others.  What if I miss a dose?  It is important not to miss your dose. Call your doctor or health care professional if you are  unable to keep an appointment.  What may interact with this medicine?  Do not take this medicine with any of the following medications:  -other medicines containing denosumab  This medicine may also interact with the following medications:  -medicines that suppress the immune system  -medicines that treat cancer  -steroid medicines like prednisone or cortisone  This list may not describe all possible interactions. Give your health care provider a list of all the medicines, herbs, non-prescription drugs, or dietary supplements you use. Also tell them if you smoke, drink alcohol, or use illegal drugs. Some items may interact with your medicine.  What should I watch for while using this medicine?  Visit your doctor or health care professional for regular checks on your progress. Your doctor or health care professional may order blood tests and other tests to see how you are doing.  Call your doctor or health care professional if you get a cold or other infection while receiving this medicine. Do not treat yourself. This medicine may decrease your body's ability to fight infection.  You should make sure you get enough calcium and vitamin D while you are taking this medicine, unless your doctor tells you not to. Discuss the foods you eat and the vitamins you take with your health care professional.  See your dentist regularly. Brush and floss your teeth as directed. Before you have any dental work done, tell your dentist you are receiving this medicine.  Do   not become pregnant while taking this medicine or for 5 months after stopping it. Women should inform their doctor if they wish to become pregnant or think they might be pregnant. There is a potential for serious side effects to an unborn child. Talk to your health care professional or pharmacist for more information.  What side effects may I notice from receiving this medicine?  Side effects that you should report to your doctor or health care professional as soon as  possible:  -allergic reactions like skin rash, itching or hives, swelling of the face, lips, or tongue  -breathing problems  -chest pain  -fast, irregular heartbeat  -feeling faint or lightheaded, falls  -fever, chills, or any other sign of infection  -muscle spasms, tightening, or twitches  -numbness or tingling  -skin blisters or bumps, or is dry, peels, or red  -slow healing or unexplained pain in the mouth or jaw  -unusual bleeding or bruising  Side effects that usually do not require medical attention (Report these to your doctor or health care professional if they continue or are bothersome.):  -muscle pain  -stomach upset, gas  This list may not describe all possible side effects. Call your doctor for medical advice about side effects. You may report side effects to FDA at 1-800-FDA-1088.  Where should I keep my medicine?  This medicine is only given in a clinic, doctor's office, or other health care setting and will not be stored at home.  NOTE: This sheet is a summary. It may not cover all possible information. If you have questions about this medicine, talk to your doctor, pharmacist, or health care provider.      2016, Elsevier/Gold Standard. (2012-06-16 12:37:47)

## 2017-01-15 ENCOUNTER — Other Ambulatory Visit (HOSPITAL_COMMUNITY): Payer: Self-pay | Admitting: *Deleted

## 2017-01-16 ENCOUNTER — Inpatient Hospital Stay (HOSPITAL_COMMUNITY): Admission: RE | Admit: 2017-01-16 | Payer: Medicare Other | Source: Ambulatory Visit

## 2017-01-21 ENCOUNTER — Ambulatory Visit (HOSPITAL_COMMUNITY)
Admission: RE | Admit: 2017-01-21 | Discharge: 2017-01-21 | Disposition: A | Discharge status: Home / Self Care | Payer: Medicare Other | Source: Ambulatory Visit | Attending: Endocrinology | Admitting: Endocrinology

## 2017-01-21 DIAGNOSIS — M810 Age-related osteoporosis without current pathological fracture: Secondary | ICD-10-CM | POA: Diagnosis not present

## 2017-01-21 MED ORDER — DENOSUMAB 60 MG/ML ~~LOC~~ SOLN
60.0000 mg | Freq: Once | SUBCUTANEOUS | Status: AC
  Administered 2017-01-21: 60 mg via SUBCUTANEOUS
  Filled 2017-01-21: qty 1

## 2017-04-04 ENCOUNTER — Other Ambulatory Visit: Payer: Self-pay | Admitting: Endocrinology

## 2017-04-04 DIAGNOSIS — Z1231 Encounter for screening mammogram for malignant neoplasm of breast: Secondary | ICD-10-CM

## 2017-04-18 ENCOUNTER — Ambulatory Visit
Admission: RE | Admit: 2017-04-18 | Discharge: 2017-04-18 | Disposition: A | Discharge status: Home / Self Care | Payer: Medicare Other | Source: Ambulatory Visit | Attending: Endocrinology | Admitting: Endocrinology

## 2017-04-18 ENCOUNTER — Ambulatory Visit: Payer: Medicare Other

## 2017-04-18 DIAGNOSIS — Z1231 Encounter for screening mammogram for malignant neoplasm of breast: Secondary | ICD-10-CM

## 2018-05-15 ENCOUNTER — Encounter (HOSPITAL_COMMUNITY): Payer: Medicare Other

## 2018-05-16 ENCOUNTER — Other Ambulatory Visit (HOSPITAL_COMMUNITY): Payer: Self-pay

## 2018-05-19 ENCOUNTER — Ambulatory Visit (HOSPITAL_COMMUNITY)
Admission: RE | Admit: 2018-05-19 | Discharge: 2018-05-19 | Disposition: A | Discharge status: Home / Self Care | Payer: Medicare Other | Source: Ambulatory Visit | Attending: Endocrinology | Admitting: Endocrinology

## 2018-05-19 DIAGNOSIS — M810 Age-related osteoporosis without current pathological fracture: Secondary | ICD-10-CM | POA: Insufficient documentation

## 2018-05-19 MED ORDER — DENOSUMAB 60 MG/ML ~~LOC~~ SOSY
60.0000 mg | PREFILLED_SYRINGE | Freq: Once | SUBCUTANEOUS | Status: AC
  Administered 2018-05-19: 60 mg via SUBCUTANEOUS
  Filled 2018-05-19: qty 1

## 2019-02-16 ENCOUNTER — Other Ambulatory Visit (HOSPITAL_COMMUNITY): Payer: Self-pay | Admitting: *Deleted

## 2019-02-17 ENCOUNTER — Ambulatory Visit (HOSPITAL_COMMUNITY)
Admission: RE | Admit: 2019-02-17 | Discharge: 2019-02-17 | Disposition: A | Discharge status: Home / Self Care | Payer: Medicare Other | Source: Ambulatory Visit | Attending: Endocrinology | Admitting: Endocrinology

## 2019-02-17 DIAGNOSIS — M810 Age-related osteoporosis without current pathological fracture: Secondary | ICD-10-CM | POA: Diagnosis not present

## 2019-02-17 MED ORDER — DENOSUMAB 60 MG/ML ~~LOC~~ SOSY
60.0000 mg | PREFILLED_SYRINGE | Freq: Once | SUBCUTANEOUS | Status: AC
  Administered 2019-02-17: 60 mg via SUBCUTANEOUS

## 2019-02-17 MED ORDER — DENOSUMAB 60 MG/ML ~~LOC~~ SOSY
PREFILLED_SYRINGE | SUBCUTANEOUS | Status: AC
  Administered 2019-02-17: 60 mg via SUBCUTANEOUS
  Filled 2019-02-17: qty 1

## 2019-08-10 ENCOUNTER — Other Ambulatory Visit: Payer: Self-pay | Admitting: Endocrinology

## 2019-08-10 DIAGNOSIS — Z1231 Encounter for screening mammogram for malignant neoplasm of breast: Secondary | ICD-10-CM

## 2019-08-12 ENCOUNTER — Other Ambulatory Visit: Payer: Self-pay

## 2019-08-12 ENCOUNTER — Ambulatory Visit
Admission: RE | Admit: 2019-08-12 | Discharge: 2019-08-12 | Disposition: A | Discharge status: Home / Self Care | Payer: Medicare Other | Source: Ambulatory Visit | Attending: Endocrinology | Admitting: Endocrinology

## 2019-08-12 DIAGNOSIS — Z1231 Encounter for screening mammogram for malignant neoplasm of breast: Secondary | ICD-10-CM

## 2019-08-28 ENCOUNTER — Other Ambulatory Visit (HOSPITAL_COMMUNITY): Payer: Self-pay | Admitting: *Deleted

## 2019-08-31 ENCOUNTER — Ambulatory Visit (HOSPITAL_COMMUNITY)
Admission: RE | Admit: 2019-08-31 | Discharge: 2019-08-31 | Disposition: A | Discharge status: Home / Self Care | Payer: Medicare Other | Source: Ambulatory Visit | Attending: Endocrinology | Admitting: Endocrinology

## 2019-08-31 ENCOUNTER — Other Ambulatory Visit: Payer: Self-pay

## 2019-08-31 DIAGNOSIS — M810 Age-related osteoporosis without current pathological fracture: Secondary | ICD-10-CM | POA: Insufficient documentation

## 2019-08-31 MED ORDER — DENOSUMAB 60 MG/ML ~~LOC~~ SOSY
PREFILLED_SYRINGE | SUBCUTANEOUS | Status: AC
  Administered 2019-08-31: 60 mg via SUBCUTANEOUS
  Filled 2019-08-31: qty 1

## 2019-08-31 MED ORDER — DENOSUMAB 60 MG/ML ~~LOC~~ SOSY
60.0000 mg | PREFILLED_SYRINGE | Freq: Once | SUBCUTANEOUS | Status: AC
  Administered 2019-08-31: 13:00:00 60 mg via SUBCUTANEOUS

## 2019-09-02 ENCOUNTER — Encounter (HOSPITAL_COMMUNITY): Payer: Medicare Other

## 2019-11-24 ENCOUNTER — Inpatient Hospital Stay (HOSPITAL_COMMUNITY): Payer: Medicare Other

## 2019-11-24 ENCOUNTER — Encounter (HOSPITAL_COMMUNITY): Payer: Self-pay | Admitting: Emergency Medicine

## 2019-11-24 ENCOUNTER — Other Ambulatory Visit: Payer: Self-pay

## 2019-11-24 ENCOUNTER — Emergency Department (HOSPITAL_COMMUNITY): Payer: Medicare Other

## 2019-11-24 ENCOUNTER — Inpatient Hospital Stay (HOSPITAL_COMMUNITY)
Admission: EM | Admit: 2019-11-24 | Discharge: 2019-11-27 | DRG: 493 | Disposition: A | Discharge status: Home Health | Payer: Medicare Other | Attending: Internal Medicine | Admitting: Internal Medicine

## 2019-11-24 DIAGNOSIS — R000 Tachycardia, unspecified: Secondary | ICD-10-CM | POA: Diagnosis present

## 2019-11-24 DIAGNOSIS — Z20828 Contact with and (suspected) exposure to other viral communicable diseases: Secondary | ICD-10-CM | POA: Diagnosis present

## 2019-11-24 DIAGNOSIS — Z7983 Long term (current) use of bisphosphonates: Secondary | ICD-10-CM | POA: Diagnosis not present

## 2019-11-24 DIAGNOSIS — M810 Age-related osteoporosis without current pathological fracture: Secondary | ICD-10-CM | POA: Diagnosis present

## 2019-11-24 DIAGNOSIS — E785 Hyperlipidemia, unspecified: Secondary | ICD-10-CM | POA: Diagnosis present

## 2019-11-24 DIAGNOSIS — Z419 Encounter for procedure for purposes other than remedying health state, unspecified: Secondary | ICD-10-CM

## 2019-11-24 DIAGNOSIS — Z8249 Family history of ischemic heart disease and other diseases of the circulatory system: Secondary | ICD-10-CM

## 2019-11-24 DIAGNOSIS — S82201A Unspecified fracture of shaft of right tibia, initial encounter for closed fracture: Secondary | ICD-10-CM | POA: Diagnosis present

## 2019-11-24 DIAGNOSIS — S92131A Displaced fracture of posterior process of right talus, initial encounter for closed fracture: Secondary | ICD-10-CM | POA: Diagnosis present

## 2019-11-24 DIAGNOSIS — T148XXA Other injury of unspecified body region, initial encounter: Secondary | ICD-10-CM

## 2019-11-24 DIAGNOSIS — D72829 Elevated white blood cell count, unspecified: Secondary | ICD-10-CM | POA: Diagnosis present

## 2019-11-24 DIAGNOSIS — S82891A Other fracture of right lower leg, initial encounter for closed fracture: Secondary | ICD-10-CM | POA: Diagnosis not present

## 2019-11-24 DIAGNOSIS — S82871A Displaced pilon fracture of right tibia, initial encounter for closed fracture: Secondary | ICD-10-CM | POA: Diagnosis present

## 2019-11-24 DIAGNOSIS — S82301A Unspecified fracture of lower end of right tibia, initial encounter for closed fracture: Secondary | ICD-10-CM

## 2019-11-24 DIAGNOSIS — D62 Acute posthemorrhagic anemia: Secondary | ICD-10-CM

## 2019-11-24 DIAGNOSIS — Y92410 Unspecified street and highway as the place of occurrence of the external cause: Secondary | ICD-10-CM | POA: Diagnosis not present

## 2019-11-24 DIAGNOSIS — S92351A Displaced fracture of fifth metatarsal bone, right foot, initial encounter for closed fracture: Secondary | ICD-10-CM | POA: Diagnosis present

## 2019-11-24 DIAGNOSIS — M25571 Pain in right ankle and joints of right foot: Secondary | ICD-10-CM

## 2019-11-24 DIAGNOSIS — Z720 Tobacco use: Secondary | ICD-10-CM

## 2019-11-24 DIAGNOSIS — Z7982 Long term (current) use of aspirin: Secondary | ICD-10-CM | POA: Diagnosis not present

## 2019-11-24 LAB — CBC WITH DIFFERENTIAL/PLATELET
Abs Immature Granulocytes: 0.06 10*3/uL (ref 0.00–0.07)
Basophils Absolute: 0 10*3/uL (ref 0.0–0.1)
Basophils Relative: 0 %
Eosinophils Absolute: 0 10*3/uL (ref 0.0–0.5)
Eosinophils Relative: 0 %
HCT: 42.6 % (ref 36.0–46.0)
Hemoglobin: 13.6 g/dL (ref 12.0–15.0)
Immature Granulocytes: 1 %
Lymphocytes Relative: 3 %
Lymphs Abs: 0.3 10*3/uL — ABNORMAL LOW (ref 0.7–4.0)
MCH: 30.8 pg (ref 26.0–34.0)
MCHC: 31.9 g/dL (ref 30.0–36.0)
MCV: 96.4 fL (ref 80.0–100.0)
Monocytes Absolute: 0.5 10*3/uL (ref 0.1–1.0)
Monocytes Relative: 4 %
Neutro Abs: 11.8 10*3/uL — ABNORMAL HIGH (ref 1.7–7.7)
Neutrophils Relative %: 92 %
Platelets: 183 10*3/uL (ref 150–400)
RBC: 4.42 MIL/uL (ref 3.87–5.11)
RDW: 12.8 % (ref 11.5–15.5)
WBC: 12.7 10*3/uL — ABNORMAL HIGH (ref 4.0–10.5)
nRBC: 0 % (ref 0.0–0.2)

## 2019-11-24 LAB — BASIC METABOLIC PANEL
Anion gap: 11 (ref 5–15)
BUN: 19 mg/dL (ref 8–23)
CO2: 22 mmol/L (ref 22–32)
Calcium: 8.6 mg/dL — ABNORMAL LOW (ref 8.9–10.3)
Chloride: 104 mmol/L (ref 98–111)
Creatinine, Ser: 0.84 mg/dL (ref 0.44–1.00)
GFR calc Af Amer: >60 mL/min (ref >60)
GFR calc non Af Amer: >60 mL/min (ref >60)
Glucose, Bld: 144 mg/dL — ABNORMAL HIGH (ref 70–99)
Potassium: 4.1 mmol/L (ref 3.5–5.1)
Sodium: 137 mmol/L (ref 135–145)

## 2019-11-24 LAB — SARS CORONAVIRUS 2 (TAT 6-24 HRS): SARS Coronavirus 2: NEGATIVE

## 2019-11-24 LAB — TROPONIN I (HIGH SENSITIVITY)
Troponin I (High Sensitivity): 4 ng/L (ref <18)
Troponin I (High Sensitivity): 5 ng/L (ref <18)

## 2019-11-24 MED ORDER — TRAMADOL HCL 50 MG PO TABS: 50.0000 mg | ORAL_TABLET | Freq: Four times a day (QID) | ORAL | Status: DC | PRN

## 2019-11-24 MED ORDER — SODIUM CHLORIDE 0.9 % IV BOLUS
500.0000 mL | Freq: Once | INTRAVENOUS | Status: AC
  Administered 2019-11-24: 500 mL via INTRAVENOUS

## 2019-11-24 MED ORDER — ENOXAPARIN SODIUM 40 MG/0.4ML ~~LOC~~ SOLN: 40.0000 mg | SUBCUTANEOUS | Status: DC

## 2019-11-24 MED ORDER — ALPRAZOLAM 0.25 MG PO TABS: 0.1250 mg | ORAL_TABLET | ORAL | Status: DC | PRN

## 2019-11-24 MED ORDER — ENOXAPARIN SODIUM 40 MG/0.4ML ~~LOC~~ SOLN
40.0000 mg | SUBCUTANEOUS | Status: DC
  Administered 2019-11-26 – 2019-11-27 (×2): 40 mg via SUBCUTANEOUS
  Filled 2019-11-24 (×2): qty 0.4

## 2019-11-24 MED ORDER — FENTANYL CITRATE (PF) 100 MCG/2ML IJ SOLN
50.0000 ug | Freq: Once | INTRAMUSCULAR | Status: AC
  Administered 2019-11-24: 50 ug via INTRAVENOUS
  Filled 2019-11-24: qty 2

## 2019-11-24 MED ORDER — OXYCODONE-ACETAMINOPHEN 5-325 MG PO TABS
1.0000 | ORAL_TABLET | ORAL | Status: DC | PRN
  Administered 2019-11-24: 1 via ORAL
  Filled 2019-11-24 (×2): qty 1

## 2019-11-24 MED ORDER — ASPIRIN 81 MG PO CHEW
81.0000 mg | CHEWABLE_TABLET | ORAL | Status: DC
  Administered 2019-11-26: 81 mg via ORAL
  Filled 2019-11-24: qty 1

## 2019-11-24 MED ORDER — ROSUVASTATIN CALCIUM 5 MG PO TABS: 10.0000 mg | ORAL_TABLET | ORAL | Status: DC

## 2019-11-24 MED ORDER — DOCUSATE SODIUM 100 MG PO CAPS
100.0000 mg | ORAL_CAPSULE | Freq: Two times a day (BID) | ORAL | Status: DC
  Administered 2019-11-25 – 2019-11-27 (×4): 100 mg via ORAL
  Filled 2019-11-24 (×4): qty 1

## 2019-11-24 MED ORDER — TRAZODONE HCL 50 MG PO TABS: 25.0000 mg | ORAL_TABLET | Freq: Every evening | ORAL | Status: DC | PRN
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
## 2019-11-24 NOTE — ED Notes (Signed)
Pt had large bowel movement in bedside commode. Pt complaining of sternum pain. Pt able to assist in pulling up in bed.

## 2019-11-24 NOTE — ED Notes (Signed)
Splint applied to right ankle, leg.  Patient tolerated well.

## 2019-11-24 NOTE — ED Notes (Signed)
Contacted [facility] at this time for pt transport. Megan Boyle
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
## 2019-11-24 NOTE — ED Notes (Signed)
Bedside commode placed in patient's room

## 2019-11-24 NOTE — ED Provider Notes (Signed)
D/w Dr. Illene Regulus of trauma service.  States she will consult.  Agrees with hospitalist admission with orthopedic consult  Discussed with Dr. Linda Hedges hospitalist service who will admit and arrange transfer to [facility].   EKG Interpretation  Date/Time:  Tuesday November 24 2019 18:33:34 EST Ventricular Rate:  108 PR Interval:    QRS Duration: 76 QT Interval:  325 QTC Calculation: 436 R Axis:   13 Text Interpretation: Sinus tachycardia No previous ECGs available Confirmed by Ezequiel Essex [phone]) on 11/24/2019 6:36:42 PM         Aarthi Uyeno, Annie Main, MD 11/24/19 2345

## 2019-11-25 ENCOUNTER — Encounter (HOSPITAL_COMMUNITY): Payer: Self-pay | Admitting: Surgery

## 2019-11-25 ENCOUNTER — Encounter (HOSPITAL_COMMUNITY)
Admission: EM | Disposition: A | Discharge status: Home Health | Payer: Self-pay | Source: Home / Self Care | Attending: Internal Medicine

## 2019-11-25 ENCOUNTER — Inpatient Hospital Stay (HOSPITAL_COMMUNITY): Payer: Medicare Other | Admitting: Certified Registered Nurse Anesthetist

## 2019-11-25 ENCOUNTER — Inpatient Hospital Stay (HOSPITAL_COMMUNITY): Payer: Medicare Other

## 2019-11-25 HISTORY — PX: ORIF ANKLE FRACTURE: SHX5408

## 2019-11-25 LAB — CBC
HCT: 37.3 % (ref 36.0–46.0)
Hemoglobin: 12.4 g/dL (ref 12.0–15.0)
MCH: 31.4 pg (ref 26.0–34.0)
MCHC: 33.2 g/dL (ref 30.0–36.0)
MCV: 94.4 fL (ref 80.0–100.0)
Platelets: 178 10*3/uL (ref 150–400)
RBC: 3.95 MIL/uL (ref 3.87–5.11)
RDW: 12.7 % (ref 11.5–15.5)
WBC: 8.8 10*3/uL (ref 4.0–10.5)
nRBC: 0 % (ref 0.0–0.2)

## 2019-11-25 LAB — BASIC METABOLIC PANEL
Anion gap: 8 (ref 5–15)
BUN: 17 mg/dL (ref 8–23)
CO2: 24 mmol/L (ref 22–32)
Calcium: 8.7 mg/dL — ABNORMAL LOW (ref 8.9–10.3)
Chloride: 107 mmol/L (ref 98–111)
Creatinine, Ser: 0.95 mg/dL (ref 0.44–1.00)
GFR calc Af Amer: >60 mL/min (ref >60)
GFR calc non Af Amer: 58 mL/min — ABNORMAL LOW (ref >60)
Glucose, Bld: 121 mg/dL — ABNORMAL HIGH (ref 70–99)
Potassium: 4.4 mmol/L (ref 3.5–5.1)
Sodium: 139 mmol/L (ref 135–145)

## 2019-11-25 LAB — MAGNESIUM: Magnesium: 2 mg/dL (ref 1.7–2.4)

## 2019-11-25 LAB — MRSA PCR SCREENING: MRSA by PCR: NEGATIVE

## 2019-11-25 SURGERY — OPEN REDUCTION INTERNAL FIXATION (ORIF) ANKLE FRACTURE
Anesthesia: General | Site: Ankle | Laterality: Right

## 2019-11-25 MED ORDER — FENTANYL CITRATE (PF) 100 MCG/2ML IJ SOLN
INTRAMUSCULAR | Status: DC | PRN
  Administered 2019-11-25: 50 ug via INTRAVENOUS
  Administered 2019-11-25: 25 ug via INTRAVENOUS

## 2019-11-25 MED ORDER — MIDAZOLAM HCL 2 MG/2ML IJ SOLN
INTRAMUSCULAR | Status: AC
  Filled 2019-11-25: qty 2

## 2019-11-25 MED ORDER — CLINDAMYCIN PHOSPHATE 900 MG/50ML IV SOLN
900.0000 mg | Freq: Three times a day (TID) | INTRAVENOUS | Status: AC
  Administered 2019-11-26 (×3): 900 mg via INTRAVENOUS
  Filled 2019-11-25 (×5): qty 50

## 2019-11-25 MED ORDER — FENTANYL CITRATE (PF) 100 MCG/2ML IJ SOLN: 25.0000 ug | INTRAMUSCULAR | Status: DC | PRN

## 2019-11-25 MED ORDER — GABAPENTIN 100 MG PO CAPS
100.0000 mg | ORAL_CAPSULE | Freq: Three times a day (TID) | ORAL | Status: DC
  Administered 2019-11-25 – 2019-11-27 (×5): 100 mg via ORAL
  Filled 2019-11-25 (×4): qty 1

## 2019-11-25 MED ORDER — VANCOMYCIN HCL 1000 MG IV SOLR
INTRAVENOUS | Status: AC
  Filled 2019-11-25: qty 1000

## 2019-11-25 MED ORDER — ONDANSETRON HCL 4 MG/2ML IJ SOLN
INTRAMUSCULAR | Status: DC | PRN
  Administered 2019-11-25: 4 mg via INTRAVENOUS

## 2019-11-25 MED ORDER — OXYCODONE HCL 5 MG PO TABS: 5.0000 mg | ORAL_TABLET | ORAL | Status: DC | PRN

## 2019-11-25 MED ORDER — LACTATED RINGERS IV SOLN
INTRAVENOUS | Status: DC | PRN
  Administered 2019-11-25 (×2): via INTRAVENOUS

## 2019-11-25 MED ORDER — FENTANYL CITRATE (PF) 250 MCG/5ML IJ SOLN
INTRAMUSCULAR | Status: AC
  Filled 2019-11-25: qty 5

## 2019-11-25 MED ORDER — FENTANYL CITRATE (PF) 100 MCG/2ML IJ SOLN
INTRAMUSCULAR | Status: AC
  Administered 2019-11-25: 50 ug via INTRAVENOUS
  Filled 2019-11-25: qty 2

## 2019-11-25 MED ORDER — BUPIVACAINE-EPINEPHRINE (PF) 0.5% -1:200000 IJ SOLN
INTRAMUSCULAR | Status: DC | PRN
  Administered 2019-11-25: 15 mL via PERINEURAL
  Administered 2019-11-25: 20 mL via PERINEURAL

## 2019-11-25 MED ORDER — MIDAZOLAM HCL 2 MG/2ML IJ SOLN
INTRAMUSCULAR | Status: AC
  Administered 2019-11-25: 1 mg via INTRAVENOUS
  Filled 2019-11-25: qty 2

## 2019-11-25 MED ORDER — ROCURONIUM BROMIDE 10 MG/ML (PF) SYRINGE
PREFILLED_SYRINGE | INTRAVENOUS | Status: AC
  Filled 2019-11-25: qty 10

## 2019-11-25 MED ORDER — ACETAMINOPHEN 500 MG PO TABS
1000.0000 mg | ORAL_TABLET | Freq: Four times a day (QID) | ORAL | Status: DC
  Administered 2019-11-25 – 2019-11-27 (×6): 1000 mg via ORAL
  Filled 2019-11-25 (×8): qty 2

## 2019-11-25 MED ORDER — PHENYLEPHRINE HCL-NACL 10-0.9 MG/250ML-% IV SOLN
INTRAVENOUS | Status: DC | PRN
  Administered 2019-11-25: 20 ug/min via INTRAVENOUS

## 2019-11-25 MED ORDER — LIDOCAINE 2% (20 MG/ML) 5 ML SYRINGE
INTRAMUSCULAR | Status: AC
  Filled 2019-11-25: qty 5

## 2019-11-25 MED ORDER — MIDAZOLAM HCL 2 MG/2ML IJ SOLN
1.0000 mg | Freq: Once | INTRAMUSCULAR | Status: AC
  Administered 2019-11-25: 17:00:00 1 mg via INTRAVENOUS

## 2019-11-25 MED ORDER — 0.9 % SODIUM CHLORIDE (POUR BTL) OPTIME
TOPICAL | Status: DC | PRN
  Administered 2019-11-25: 1000 mL

## 2019-11-25 MED ORDER — MORPHINE SULFATE (PF) 2 MG/ML IV SOLN: 2.0000 mg | INTRAVENOUS | Status: DC | PRN

## 2019-11-25 MED ORDER — PHENYLEPHRINE HCL (PRESSORS) 10 MG/ML IV SOLN
INTRAVENOUS | Status: DC | PRN
  Administered 2019-11-25 (×2): 80 ug via INTRAVENOUS

## 2019-11-25 MED ORDER — PHENYLEPHRINE 40 MCG/ML (10ML) SYRINGE FOR IV PUSH (FOR BLOOD PRESSURE SUPPORT)
PREFILLED_SYRINGE | INTRAVENOUS | Status: AC
  Filled 2019-11-25: qty 10

## 2019-11-25 MED ORDER — VANCOMYCIN HCL 1000 MG IV SOLR
INTRAVENOUS | Status: DC | PRN
  Administered 2019-11-25: 1000 mg via TOPICAL

## 2019-11-25 MED ORDER — PROPOFOL 10 MG/ML IV BOLUS
INTRAVENOUS | Status: AC
  Filled 2019-11-25: qty 20

## 2019-11-25 MED ORDER — PROPOFOL 10 MG/ML IV BOLUS
INTRAVENOUS | Status: DC | PRN
  Administered 2019-11-25: 100 mg via INTRAVENOUS

## 2019-11-25 MED ORDER — ONDANSETRON HCL 4 MG/2ML IJ SOLN
INTRAMUSCULAR | Status: AC
  Filled 2019-11-25: qty 2

## 2019-11-25 MED ORDER — DEXAMETHASONE SODIUM PHOSPHATE 10 MG/ML IJ SOLN
INTRAMUSCULAR | Status: DC | PRN
  Administered 2019-11-25: 4 mg via INTRAVENOUS

## 2019-11-25 MED ORDER — CLINDAMYCIN PHOSPHATE 900 MG/50ML IV SOLN
900.0000 mg | INTRAVENOUS | Status: AC
  Administered 2019-11-25: 900 mg via INTRAVENOUS
  Filled 2019-11-25: qty 50

## 2019-11-25 MED ORDER — DEXAMETHASONE SODIUM PHOSPHATE 10 MG/ML IJ SOLN
INTRAMUSCULAR | Status: AC
  Filled 2019-11-25: qty 1

## 2019-11-25 MED ORDER — FENTANYL CITRATE (PF) 100 MCG/2ML IJ SOLN
50.0000 ug | Freq: Once | INTRAMUSCULAR | Status: AC
  Administered 2019-11-25: 17:00:00 50 ug via INTRAVENOUS

## 2019-11-25 SURGICAL SUPPLY — 54 items
APL PRP STRL LF DISP 70% ISPRP (MISCELLANEOUS) ×1
BANDAGE ESMARK 6X9 LF (GAUZE/BANDAGES/DRESSINGS) ×1 IMPLANT
BIT DRILL 2.5X2.75 QC CALB (BIT) ×1 IMPLANT
BIT DRILL CALIBRATED 2.7 (BIT) ×1 IMPLANT
BNDG CMPR 9X6 STRL LF SNTH (GAUZE/BANDAGES/DRESSINGS) ×1
BNDG ELASTIC 4X5.8 VLCR STR LF (GAUZE/BANDAGES/DRESSINGS) ×1 IMPLANT
BNDG ELASTIC 6X5.8 VLCR STR LF (GAUZE/BANDAGES/DRESSINGS) ×1 IMPLANT
BNDG ESMARK 6X9 LF (GAUZE/BANDAGES/DRESSINGS) ×2
BRUSH SCRUB EZ PLAIN DRY (MISCELLANEOUS) ×3 IMPLANT
CHLORAPREP W/TINT 26 (MISCELLANEOUS) ×2 IMPLANT
CLSR STERI-STRIP ANTIMIC 1/2X4 (GAUZE/BANDAGES/DRESSINGS) ×1 IMPLANT
COVER SURGICAL LIGHT HANDLE (MISCELLANEOUS) ×2 IMPLANT
DRAPE C-ARM 42X72 X-RAY (DRAPES) ×2 IMPLANT
DRAPE C-ARMOR (DRAPES) ×2 IMPLANT
DRAPE U-SHAPE 47X51 STRL (DRAPES) ×2 IMPLANT
ELECT REM PT RETURN 9FT ADLT (ELECTROSURGICAL) ×2
ELECTRODE REM PT RTRN 9FT ADLT (ELECTROSURGICAL) ×1 IMPLANT
GAUZE SPONGE 4X4 12PLY STRL (GAUZE/BANDAGES/DRESSINGS) ×1 IMPLANT
GLOVE BIO SURGEON STRL SZ 6.5 (GLOVE) ×5 IMPLANT
GLOVE BIO SURGEON STRL SZ7.5 (GLOVE) ×5 IMPLANT
GLOVE BIOGEL PI IND STRL 6.5 (GLOVE) ×1 IMPLANT
GLOVE BIOGEL PI IND STRL 7.5 (GLOVE) ×1 IMPLANT
GLOVE BIOGEL PI INDICATOR 6.5 (GLOVE) ×1
GLOVE BIOGEL PI INDICATOR 7.5 (GLOVE) ×1
GOWN STRL REUS W/ TWL LRG LVL3 (GOWN DISPOSABLE) ×2 IMPLANT
GOWN STRL REUS W/TWL LRG LVL3 (GOWN DISPOSABLE) ×4
K-WIRE ACE 1.6X6 (WIRE) ×4
KIT TURNOVER KIT B (KITS) ×2 IMPLANT
KWIRE ACE 1.6X6 (WIRE) IMPLANT
MANIFOLD NEPTUNE II (INSTRUMENTS) ×2 IMPLANT
NS IRRIG 1000ML POUR BTL (IV SOLUTION) ×2 IMPLANT
PACK TOTAL JOINT (CUSTOM PROCEDURE TRAY) ×2 IMPLANT
PAD ARMBOARD 7.5X6 YLW CONV (MISCELLANEOUS) ×3 IMPLANT
PAD CAST 4YDX4 CTTN HI CHSV (CAST SUPPLIES) IMPLANT
PADDING CAST COTTON 4X4 STRL (CAST SUPPLIES) ×2
PADDING CAST COTTON 6X4 STRL (CAST SUPPLIES) ×1 IMPLANT
PLATE 9H 184 RT MED DIST TIB (Plate) ×1 IMPLANT
SCREW LOCK CORT STAR 3.5X34 (Screw) ×1 IMPLANT
SCREW LOCK CORT STAR 3.5X38 (Screw) ×2 IMPLANT
SCREW LOCK CORT STAR 3.5X40 (Screw) ×1 IMPLANT
SCREW LP NL T15 3.5X20 (Screw) ×2 IMPLANT
SCREW LP NL T15 3.5X22 (Screw) ×2 IMPLANT
SCREW T15 LP CORT 3.5X40MM NS (Screw) ×1 IMPLANT
STAPLER VISISTAT 35W (STAPLE) ×2 IMPLANT
SUCTION FRAZIER HANDLE 10FR (MISCELLANEOUS) ×1
SUCTION TUBE FRAZIER 10FR DISP (MISCELLANEOUS) ×1 IMPLANT
SUT ETHILON 3 0 PS 1 (SUTURE) ×3 IMPLANT
SUT VIC AB 0 CT1 27 (SUTURE) ×2
SUT VIC AB 0 CT1 27XBRD ANBCTR (SUTURE) ×1 IMPLANT
SUT VIC AB 2-0 CT1 27 (SUTURE) ×2
SUT VIC AB 2-0 CT1 TAPERPNT 27 (SUTURE) ×2 IMPLANT
TOWEL GREEN STERILE (TOWEL DISPOSABLE) ×4 IMPLANT
TOWEL GREEN STERILE FF (TOWEL DISPOSABLE) ×2 IMPLANT
UNDERPAD 30X30 (UNDERPADS AND DIAPERS) ×2 IMPLANT

## 2019-11-25 NOTE — Plan of Care (Signed)

## 2019-11-25 NOTE — Anesthesia Preprocedure Evaluation (Addendum)
Anesthesia Evaluation  Patient identified by MRN, date of birth, ID band Patient awake    Reviewed: Allergy & Precautions, H&P , NPO status , Patient's Chart, lab work & pertinent test results  Airway Mallampati: II  TM Distance: >3 FB Neck ROM: Full    Dental no notable dental hx. (+) Teeth Intact, Dental Advisory Given   Pulmonary neg pulmonary ROS,    Pulmonary exam normal breath sounds clear to auscultation       Cardiovascular negative cardio ROS   Rhythm:Regular Rate:Normal     Neuro/Psych negative neurological ROS  negative psych ROS   GI/Hepatic negative GI ROS, Neg liver ROS,   Endo/Other  negative endocrine ROS  Renal/GU negative Renal ROS  negative genitourinary   Musculoskeletal   Abdominal   Peds  Hematology negative hematology ROS (+)   Anesthesia Other Findings   Reproductive/Obstetrics negative OB ROS                            Anesthesia Physical Anesthesia Plan  ASA: I  Anesthesia Plan: General   Post-op Pain Management:  Regional for Post-op pain   Induction: Intravenous  PONV Risk Score and Plan: 3 and Ondansetron, Dexamethasone and Treatment may vary due to age or medical condition  Airway Management Planned: LMA  Additional Equipment:   Intra-op Plan:   Post-operative Plan: Extubation in OR  Informed Consent: I have reviewed the patients History and Physical, chart, labs and discussed the procedure including the risks, benefits and alternatives for the proposed anesthesia with the patient or authorized representative who has indicated his/her understanding and acceptance.     Dental advisory given  Plan Discussed with: CRNA  Anesthesia Plan Comments:         Anesthesia Quick Evaluation
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
## 2019-11-25 NOTE — Transfer of Care (Signed)
Immediate Anesthesia Transfer of Care Note  Patient: Megan Boyle  Procedure(s) Performed: OPEN REDUCTION INTERNAL FIXATION (ORIF) RIGHT ANKLE FRACTURE (Right Ankle)  Patient Location: PACU  Anesthesia Type:GA combined with regional for post-op pain  Level of Consciousness: awake, drowsy and patient cooperative  Airway & Oxygen Therapy: Patient Spontanous Breathing  Post-op Assessment: Report given to RN and Post -op Vital signs reviewed and stable  Post vital signs: Reviewed and stable  Last Vitals:  Vitals Value Taken Time  BP    Temp    Pulse 96 11/25/19 1916  Resp 16 11/25/19 1916  SpO2 92 % 11/25/19 1916  Vitals shown include unvalidated device data.  Last Pain:  Vitals:   11/25/19 1715  TempSrc:   PainSc: 0-No pain         Complications: No apparent anesthesia complications

## 2019-11-25 NOTE — Anesthesia Postprocedure Evaluation (Signed)
Anesthesia Post Note  Patient: PURITY BENET  Procedure(s) Performed: OPEN REDUCTION INTERNAL FIXATION (ORIF) RIGHT ANKLE FRACTURE (Right Ankle)     Patient location during evaluation: PACU Anesthesia Type: General and Regional Level of consciousness: awake Pain management: pain level controlled Vital Signs Assessment: post-procedure vital signs reviewed and stable Respiratory status: spontaneous breathing, nonlabored ventilation, respiratory function stable and patient connected to nasal cannula oxygen Cardiovascular status: blood pressure returned to baseline and stable Postop Assessment: no apparent nausea or vomiting Anesthetic complications: no    Last Vitals:  Vitals:   11/25/19 1930 11/25/19 2013  BP: (!) 123/57 130/64  Pulse:  88  Resp:    Temp:  37.3 C  SpO2:  91%    Last Pain:  Vitals:   11/25/19 2013  TempSrc: Oral  PainSc:                  Karyl Kinnier Ellender

## 2019-11-25 NOTE — Anesthesia Procedure Notes (Signed)
Anesthesia Regional Block: Popliteal block   Pre-Anesthetic Checklist: ,, timeout performed, Correct Patient, Correct Site, Correct Laterality, Correct Procedure, Correct Position, site marked, Risks and benefits discussed, pre-op evaluation,  At surgeon's request and post-op pain management  Laterality: Right  Prep: Maximum Sterile Barrier Precautions used, chloraprep       Needles:  Injection technique: Single-shot  Needle Type: Echogenic Stimulator Needle     Needle Length: 9cm  Needle Gauge: 21     Additional Needles:   Procedures:,,,, ultrasound used (permanent image in chart),,,,  Narrative:  Start time: 11/25/2019 4:55 PM End time: 11/25/2019 5:05 PM Injection made incrementally with aspirations every 5 mL. Anesthesiologist: Roderic Palau, MD  Additional Notes: 2% Lidocaine skin wheel. Adductor canal block with 20cc of 0.5% Bupivicaine w/1:200k epi.
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
## 2019-11-25 NOTE — Anesthesia Procedure Notes (Signed)
Procedure Name: LMA Insertion Date/Time: 11/25/2019 5:47 PM Performed by: Inda Coke, CRNA Pre-anesthesia Checklist: Patient identified, Emergency Drugs available, Suction available and Patient being monitored Patient Re-evaluated:Patient Re-evaluated prior to induction Oxygen Delivery Method: Circle System Utilized Preoxygenation: Pre-oxygenation with 100% oxygen Induction Type: IV induction Ventilation: Mask ventilation without difficulty LMA: LMA inserted LMA Size: 3.0 Number of attempts: 1 Airway Equipment and Method: Bite block Placement Confirmation: positive ETCO2 Tube secured with: Tape Dental Injury: Teeth and Oropharynx as per pre-operative assessment
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
## 2019-11-26 LAB — CBC
HCT: 32.8 % — ABNORMAL LOW (ref 36.0–46.0)
Hemoglobin: 11 g/dL — ABNORMAL LOW (ref 12.0–15.0)
MCH: 31.4 pg (ref 26.0–34.0)
MCHC: 33.5 g/dL (ref 30.0–36.0)
MCV: 93.7 fL (ref 80.0–100.0)
Platelets: 139 10*3/uL — ABNORMAL LOW (ref 150–400)
RBC: 3.5 MIL/uL — ABNORMAL LOW (ref 3.87–5.11)
RDW: 12.6 % (ref 11.5–15.5)
WBC: 7.1 10*3/uL (ref 4.0–10.5)
nRBC: 0 % (ref 0.0–0.2)

## 2019-11-26 LAB — VITAMIN D 25 HYDROXY (VIT D DEFICIENCY, FRACTURES): Vit D, 25-Hydroxy: 82.49 ng/mL (ref 30–100)
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
## 2019-11-26 NOTE — Progress Notes (Signed)
   11/26/19 1200  Home Living  Family/patient expects to be discharged to: Private residence  Available Help at Discharge Family;Available PRN/intermittently  PT Recommendation  Follow Up Recommendations Home health PT;Supervision/Assistance - 24 hour  PT equipment Youth Rolling walker with 5" wheels;3in1 (PT);Wheelchair (16x16 lightweight with anti tippers, desk armrests and elevating legrests));Wheelchair cushion (pressure relieving cushion 16x16)  Fullnote to follow.  CM - please advise pt and son if it would be better to purchase wheelchair and 3N1 and just getting a youth RW themselves versus buying a youth RW and 3N1 and then renting wheelchair as do not feel that she will need wheelchair longer than 3 months.  Recommend [place], [place], [place].  Thanks. Onyekachi Gathright W,PT Acute Rehabilitation Services Pager:  [phone]  Office:  [phone]
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
## 2019-11-26 NOTE — Progress Notes (Signed)
Physical Therapy Discharge Patient Details Name: Megan Boyle MRN: [id number] DOB: Jul 20, 1941 Today's Date: 11/26/2019 Time: 1855-0158 PT Time Calculation (min) (ACUTE ONLY): 32 min  Patient discharged from PT services secondary to pt's acute PT needs met; deficits better addressed in next venue of care..  Please see latest therapy progress note for current level of functioning and progress toward goals.    Progress and discharge plan discussed with patient and/or caregiver: Patient/Caregiver agrees with plan  GP    Silvana Newness, SPT   Silvana Newness 11/26/2019, 1:02 PM
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
## 2019-11-26 NOTE — Progress Notes (Signed)
The patient will have plans to be discharged tomorrow.  She has received her discharge home equipment.  The patient is moving her right toes.  Toes are pink and warm to touch.  Patient reports that she is starting to have sensation now

## 2019-11-26 NOTE — Plan of Care (Signed)

## 2019-11-27 LAB — CBC
HCT: 33 % — ABNORMAL LOW (ref 36.0–46.0)
Hemoglobin: 10.9 g/dL — ABNORMAL LOW (ref 12.0–15.0)
MCH: 31.4 pg (ref 26.0–34.0)
MCHC: 33 g/dL (ref 30.0–36.0)
MCV: 95.1 fL (ref 80.0–100.0)
Platelets: 172 10*3/uL (ref 150–400)
RBC: 3.47 MIL/uL — ABNORMAL LOW (ref 3.87–5.11)
RDW: 12.8 % (ref 11.5–15.5)
WBC: 8.7 10*3/uL (ref 4.0–10.5)
nRBC: 0 % (ref 0.0–0.2)

## 2019-11-27 LAB — MAGNESIUM: Magnesium: 2.2 mg/dL (ref 1.7–2.4)

## 2019-11-27 LAB — BASIC METABOLIC PANEL
Anion gap: 8 (ref 5–15)
BUN: 18 mg/dL (ref 8–23)
CO2: 27 mmol/L (ref 22–32)
Calcium: 8.7 mg/dL — ABNORMAL LOW (ref 8.9–10.3)
Chloride: 108 mmol/L (ref 98–111)
Creatinine, Ser: 0.97 mg/dL (ref 0.44–1.00)
GFR calc Af Amer: >60 mL/min (ref >60)
GFR calc non Af Amer: 56 mL/min — ABNORMAL LOW (ref >60)
Glucose, Bld: 109 mg/dL — ABNORMAL HIGH (ref 70–99)
Potassium: 5.2 mmol/L — ABNORMAL HIGH (ref 3.5–5.1)
Sodium: 143 mmol/L (ref 135–145)

## 2019-11-27 MED ORDER — ASPIRIN EC 325 MG PO TBEC: 325.0000 mg | DELAYED_RELEASE_TABLET | Freq: Every day | ORAL | 0 refills | Status: DC

## 2019-11-27 MED ORDER — OXYCODONE HCL 5 MG PO TABS: 5.0000 mg | ORAL_TABLET | Freq: Four times a day (QID) | ORAL | 0 refills | Status: AC | PRN

## 2019-11-27 MED ORDER — ACETAMINOPHEN 500 MG PO TABS: 1000.0000 mg | ORAL_TABLET | Freq: Three times a day (TID) | ORAL | 0 refills | Status: AC
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
## 2019-11-27 NOTE — Discharge Instructions (Addendum)
Orthopaedic Trauma Service Discharge Instructions   General Discharge Instructions  WEIGHT BEARING STATUS: Non-weightbearing on right leg  RANGE OF MOTION/ACTIVITY: Okay for range of motion of knee. Recommend wiggling toes   Wound Care: Do not remove splint or get splint wet.   DVT/PE prophylaxis: Aspirin 325 mg daily for 30 days  Diet: as you were eating previously.  Can use over the counter stool softeners and bowel preparations, such as Miralax, to help with bowel movements.  Narcotics can be constipating.  Be sure to drink plenty of fluids  PAIN MEDICATION USE AND EXPECTATIONS  You have likely been given narcotic medications to help control your pain.  After a traumatic event that results in an fracture (broken bone) with or without surgery, it is ok to use narcotic pain medications to help control one's pain.  We understand that everyone responds to pain differently and each individual patient will be evaluated on a regular basis for the continued need for narcotic medications. Ideally, narcotic medication use should last no more than 6-8 weeks (coinciding with fracture healing).   As a patient it is your responsibility as well to monitor narcotic medication use and report the amount and frequency you use these medications when you come to your office visit.   We would also advise that if you are using narcotic medications, you should take a dose prior to therapy to maximize you participation.  IF YOU ARE ON NARCOTIC MEDICATIONS IT IS NOT PERMISSIBLE TO OPERATE A MOTOR VEHICLE (MOTORCYCLE/CAR/TRUCK/MOPED) OR HEAVY MACHINERY DO NOT MIX NARCOTICS WITH OTHER CNS (CENTRAL NERVOUS SYSTEM) DEPRESSANTS SUCH AS ALCOHOL   STOP SMOKING OR USING NICOTINE PRODUCTS!!!!  As discussed nicotine severely impairs your body's ability to heal surgical and traumatic wounds but also impairs bone healing.  Wounds and bone heal by forming microscopic blood vessels (angiogenesis) and nicotine is a  vasoconstrictor (essentially, shrinks blood vessels).  Therefore, if vasoconstriction occurs to these microscopic blood vessels they essentially disappear and are unable to deliver necessary nutrients to the healing tissue.  This is one modifiable factor that you can do to dramatically increase your chances of healing your injury.    (This means no smoking, no nicotine gum, patches, etc)  DO NOT USE NONSTEROIDAL ANTI-INFLAMMATORY DRUGS (NSAID'S)  Using products such as Advil (ibuprofen), Aleve (naproxen), Motrin (ibuprofen) for additional pain control during fracture healing can delay and/or prevent the healing response.  If you would like to take over the counter (OTC) medication, Tylenol (acetaminophen) is ok.  However, some narcotic medications that are given for pain control contain acetaminophen as well. Therefore, you should not exceed more than 4000 mg of tylenol in a day if you do not have liver disease.  Also note that there are may OTC medicines, such as cold medicines and allergy medicines that my contain tylenol as well.  If you have any questions about medications and/or interactions please ask your doctor/PA or your pharmacist.      ICE AND ELEVATE INJURED/OPERATIVE EXTREMITY  Using ice and elevating the injured extremity above your heart can help with swelling and pain control.  Icing in a pulsatile fashion, such as 20 minutes on and 20 minutes off, can be followed.    Do not place ice directly on skin. Make sure there is a barrier between to skin and the ice pack.    Using frozen items such as frozen peas works well as the conform nicely to the are that needs to be iced.  USE  AN ACE WRAP OR TED HOSE FOR SWELLING CONTROL  In addition to icing and elevation, Ace wraps or TED hose are used to help limit and resolve swelling.  It is recommended to use Ace wraps or TED hose until you are informed to stop.    When using Ace Wraps start the wrapping distally (farthest away from the body) and  wrap proximally (closer to the body)   Example: If you had surgery on your leg or thing and you do not have a splint on, start the ace wrap at the toes and work your way up to the thigh        If you had surgery on your upper extremity and do not have a splint on, start the ace wrap at your fingers and work your way up to the upper arm  IF YOU ARE IN A SPLINT OR CAST DO NOT [place]   If your splint gets wet for any reason please contact the office immediately. You may shower in your splint or cast as long as you keep it dry.  This can be done by wrapping in a cast cover or garbage back (or similar)  Do Not stick any thing down your splint or cast such as pencils, money, or hangers to try and scratch yourself with.  If you feel itchy take benadryl as prescribed on the bottle for itching   CALL THE OFFICE WITH ANY QUESTIONS OR CONCERNS: [phone]   VISIT OUR WEBSITE FOR ADDITIONAL INFORMATION: [email or web address]     Discharge Wound Care Instructions  Do NOT apply any ointments, solutions or lotions to pin sites or surgical wounds.  These prevent needed drainage and even though solutions like hydrogen peroxide kill bacteria, they also damage cells lining the pin sites that help fight infection.  Applying lotions or ointments can keep the wounds moist and can cause them to breakdown and open up as well. This can increase the risk for infection. When in doubt call the office.  Surgical incisions should be dressed daily.  If any drainage is noted, use one layer of adaptic, then gauze, Kerlix, and an ace wrap.  Once the incision is completely dry and without drainage, it may be left open to air out.  Showering may begin 36-48 hours later.  Cleaning gently with soap and water.  Traumatic wounds should be dressed daily as well.    One layer of adaptic, gauze, Kerlix, then ace wrap.  The adaptic can be discontinued once the draining has ceased    If you have a wet to dry  dressing: wet the gauze with saline the squeeze as much saline out so the gauze is moist (not soaking wet), place moistened gauze over wound, then place a dry gauze over the moist one, followed by Kerlix wrap, then ace wrap.   Cast or Splint Care, Adult Casts and splints are supports that are worn to protect broken bones and other injuries. A cast or splint may hold a bone still and in the correct position while it heals. Casts and splints may also help to ease pain, swelling, and muscle spasms. How to care for your cast   Do not stick anything inside the cast to scratch your skin.  Check the skin around the cast every day. Tell your doctor about any concerns.  You may put lotion on dry skin around the edges of the cast. Do not put lotion on the skin under the cast.  Keep the cast  clean.  If the cast is not waterproof: ? Do not let it get wet. ? Cover it with a watertight covering when you take a bath or a shower. How to care for your splint   Wear it as told by your doctor. Take it off only as told by your doctor.  Loosen the splint if your fingers or toes tingle, get numb, or turn cold and blue.  Keep the splint clean.  If the splint is not waterproof: ? Do not let it get wet. ? Cover it with a watertight covering when you take a bath or a shower. Follow these instructions at home: Bathing  Do not take baths or swim until your doctor says it is okay. Ask your doctor if you can take showers. You may only be allowed to take sponge baths for bathing.  If your cast or splint is not waterproof, cover it with a watertight covering when you take a bath or shower. Managing pain, stiffness, and swelling  Move your fingers or toes often to avoid stiffness and to lessen swelling.  Raise (elevate) the injured area above the level of your heart while sitting or lying down. Safety  Do not use the injured limb to support your body weight until your doctor says that it is okay.  Use  crutches or other assistive devices as told by your doctor. General instructions  Do not put pressure on any part of the cast or splint until it is fully hardened. This may take many hours.  Return to your normal activities as told by your doctor. Ask your doctor what activities are safe for you.  Keep all follow-up visits as told by your doctor. This is important. Contact a doctor if:  Your cast or splint gets damaged.  The skin around the cast gets red or raw.  The skin under the cast is very itchy or painful.  Your cast or splint feels very uncomfortable.  Your cast or splint is too tight or too loose.  Your cast becomes wet or it starts to have a soft spot or area.  You get an object stuck under your cast. Get help right away if:  Your pain gets worse.  The injured area tingles, gets numb, or turns blue and cold.  The part of your body above or below the cast is swollen and it turns a different color (is discolored).  You cannot feel or move your fingers or toes.  There is fluid leaking through the cast.  You have very bad pain or pressure under the cast.  You have trouble breathing.  You have shortness of breath.  You have chest pain. This information is not intended to replace advice given to you by your health care provider. Make sure you discuss any questions you have with your health care provider. Document Released: 04/18/2011 Document Revised: 04/08/2019 Document Reviewed: 12/07/2016 Elsevier Patient Education  2020 [place].   Ankle Fracture Rehab Following ankle fractures, a loss of ankle mobility and muscle strength and endurance can occur. The following exercises will help resolve these problems. Ask your health care provider which exercises are safe for you. Do exercises exactly as told by your health care provider and adjust them as directed. It is normal to feel mild stretching, pulling, tightness, or discomfort as you do these exercises. Stop  right away if you feel sudden pain or your pain gets worse. Do not begin these exercises until told by your health care provider. Stretching and  range-of-motion exercises These exercises warm up your muscles and joints and improve the movement and flexibility of your knee. These exercises may also help to relieve pain and swelling. Passive ankle eversion  This is an exercise in which you use your hands to move your ankle away from you. Your ankle does not move on its own. 1. Sit with your left / right ankle crossed over your opposite knee. 2. Hold your left / right foot with your opposite hand. 3. Gently push the top of your foot downward so your big toe moves away from you and toward the floor (eversion). Keep your lower leg steady. You should feel a gentle stretch on the inside of your ankle. 4. Hold the stretch for __________ seconds. Repeat __________ times. Complete this exercise __________ times a day. Passive ankle inversion  This is an exercise in which you use your hands to move your ankle toward you. Your ankle does not move on its own. 1. Sit with your left / right ankle crossed over your opposite knee. 2. Hold your left / right foot with your opposite hand. 3. Gently pull your foot upward toward you (inversion). Keep your lower leg steady. Your big toe will move toward you and toward the ceiling. You should feel a gentle stretch on the outside of your ankle. 4. Hold the stretch for __________ seconds. Repeat __________ times. Complete this exercise __________ times a day. Ankle alphabet  1. Sit with your left / right lower leg supported at the lower leg. ? Do not rest your foot on anything. ? Make sure your foot has room to move freely. 2. Think of your left / right foot as a paintbrush. ? Move your foot to trace each letter of the alphabet in the air. Keep your hip and knee still while you trace. ? Make the letters as large as you can without feeling discomfort. 3. Trace every  letter from A to Z. Repeat __________ times. Complete this exercise __________ times a day. Gastrocnemius  This exercise is also called calf stretch. It stretches the muscles in the back of the lower leg. 1. Sit on the floor with your left / right leg straight. 2. Loop a belt or towel around the ball of your left / right foot. 3. Keep your left / right ankle and foot relaxed and keep your knee straight while you use the belt or towel to pull your foot and ankle toward you. You should feel a gentle stretch behind your ankle or in your calf. 4. Hold this position for __________ seconds. Repeat __________ times. Complete this exercise __________ times a day. Strengthening exercises These exercises build strength and endurance in your foot and ankle. Endurance is the ability to use your muscles for a long time, even after they get tired. Towel curls  1. Sit in a chair on a non-carpeted surface, and put your feet on the floor. 2. Place a towel in front of your feet. If told by your health care provider, add __________ to the end of the towel. 3. Keeping your heel on the floor, put your left / right foot on the towel. 4. Pull the towel toward you by grabbing the towel with your toes and curling them under. Keep your heel on the floor. 5. Let your toes relax. 6. Grab the towel with your toes again. Keep going until the towel is completely underneath your foot. Repeat __________ times. Complete this exercise __________ times a day. This information is not  intended to replace advice given to you by your health care provider. Make sure you discuss any questions you have with your health care provider. Document Released: 07/18/2005 Document Revised: 04/07/2019 Document Reviewed: 09/16/2018 Elsevier Patient Education  2020 [place].   Ankle Fracture  The ankle joint is made up of the lower (distal) sections of your lower leg bones(tibia and fibula) along with a bone in your foot (talus). An  ankle fracture is a break in one, two, or all three of these sections of bone. Follow these instructions at home: If you have a splint:  Wear the splint as told by your doctor. Take it off only as told by your doctor.  Loosen the splint if your toes tingle, become numb, or turn cold and blue.  Keep the splint clean.  If the splint is not waterproof: ? Do not let it get wet. ? Cover it with a watertight covering when you take a bath or a shower. If you have a cast:  Do not stick anything inside the cast to scratch your skin. Doing that increases your risk of infection.  Check the skin around the cast every day. Tell your doctor about any concerns.  You may put lotion on dry skin around the edges of the cast. Do not put lotion on the skin underneath the cast.  Keep the cast clean.  If the cast is not waterproof: ? Do not let it get wet. ? Cover it with a watertight covering when you take a bath or a shower. Managing pain, stiffness, and swelling  If directed, put ice on the injured area: ? If you have a removable splint, remove it as told by your doctor. ? Put ice in a plastic bag. ? Place a towel between your skin and the bag. ? Leave the ice on for 20 minutes, 2-3 times a day.  Move your toes often. This prevents stiffness and lessens swelling.  Raise (elevate) the injured area above the level of your heart while you are sitting or lying down. General instructions  Do not use the injured limb to support your body weight until your doctor says that you can. Use crutches as told by your doctor.  Take over-the-counter and prescription medicines only as told by your doctor.  Ask your doctor when it is safe to drive if you have a cast or splint.  Do exercises as told by your doctor.  Do not use any products that contain nicotine or tobacco, such as cigarettes and e-cigarettes. These can delay bone healing. If you need help quitting, ask your doctor.  Keep all follow-up  visits as told by your doctor. This is important. Contact a doctor if:  Your pain or swelling gets worse.  Your pain or swelling does not get better when you rest or take medicine. Get help right away if:  Your cast gets damaged.  You continue to have very bad pain.  You have new pain or swelling.  Your skin or toes below the injured ankle: ? Turn blue or gray. ? Feel cold or numb. ? Lose sensitivity to touch. Summary  An ankle fracture is a break in one, two, or all three of the bones in your lower leg and lower foot.  If you have a splint, wear it as told by your health care provider. Keep it clean and dry.  If you have a cast, do not stick anything inside the cast to scratch your skin. This can cause infection.  Use ice, take medicines, raise your foot, and avoid tobacco and nicotine products. These steps will lessen pain and swelling and speed up healing. This information is not intended to replace advice given to you by your health care provider. Make sure you discuss any questions you have with your health care provider. Document Released: 10/14/2009 Document Revised: 11/29/2017 Document Reviewed: 01/21/2017 Elsevier Patient Education  [place].   Ankle Pain The ankle joint holds your body weight and allows you to move around. Ankle pain can occur on either side or the back of one ankle or both ankles. Ankle pain may be sharp and burning or dull and aching. There may be tenderness, stiffness, redness, or warmth around the ankle. Many things can cause ankle pain, including an injury to the area and overuse of the ankle. Follow these instructions at home: Activity  Rest your ankle as told by your health care provider. Avoid any activities that cause ankle pain.  Do not use the injured limb to support your body weight until your health care provider says that you can. Use crutches as told by your health care provider.  Do exercises as told by your health care  provider.  Ask your health care provider when it is safe to drive if you have a brace on your ankle. If you have a brace:  Wear the brace as told by your health care provider. Remove it only as told by your health care provider.  Loosen the brace if your toes tingle, become numb, or turn cold and blue.  Keep the brace clean.  If the brace is not waterproof: ? Do not let it get wet. ? Cover it with a watertight covering when you take a bath or shower. If you were given an elastic bandage:   Remove it when you take a bath or a shower.  Try not to move your ankle very much, but wiggle your toes from time to time. This helps to prevent swelling.  Adjust the bandage to make it more comfortable if it feels too tight.  Loosen the bandage if you have numbness or tingling in your foot or if your foot turns cold and blue. Managing pain, stiffness, and swelling   If directed, put ice on the painful area. ? If you have a removable brace or elastic bandage, remove it as told by your health care provider. ? Put ice in a plastic bag. ? Place a towel between your skin and the bag. ? Leave the ice on for 20 minutes, 2-3 times a day.  Move your toes often to avoid stiffness and to lessen swelling.  Raise (elevate) your ankle above the level of your heart while you are sitting or lying down. General instructions  Record information about your pain. Writing down the following may be helpful for you and your health care provider: ? How often you have ankle pain. ? Where the pain is located. ? What the pain feels like.  If treatment involves wearing a prescribed shoe or insole, make sure you wear it correctly and for as long as told by your health care provider.  Take over-the-counter and prescription medicines only as told by your health care provider.  Keep all follow-up visits as told by your health care provider. This is important. Contact a health care provider if:  Your pain gets  worse.  Your pain is not relieved with medicines.  You have a fever or chills.  You are having more trouble with  walking.  You have new symptoms. Get help right away if:  Your foot, leg, toes, or ankle: ? Tingles or becomes numb. ? Becomes swollen. ? Turns pale or blue. Summary  Ankle pain can occur on either side or the back of one ankle or both ankles.  Ankle pain may be sharp and burning or dull and aching.  Rest your ankle as told by your health care provider. If told, apply ice to the area.  Take over-the-counter and prescription medicines only as told by your health care provider.  Home Health Physical therapy   [facility]  [place], [place] [address]  This information is not intended to replace advice given to you by your health care provider. Make sure you discuss any questions you have with your health care provider. Document Released: 06/06/2010 Document Revised: 04/07/2019 Document Reviewed: 06/25/2018 Elsevier Patient Education  2020 [employer].

## 2019-11-27 NOTE — TOC Transition Note (Signed)
Transition of Care [facility]) - CM/SW Discharge Note   Patient Details  Name: Megan Boyle MRN: [id number] Date of Birth: Jul 21, 1941  Transition of Care [facility]) CM/SW Contact:  Atilano Median, LCSW Phone Number: 11/27/2019, 2:31 PM   Clinical Narrative:    Discharged home. DME delivered to room prior to discharge. Family will transport. No other needs at this time. Case closed to this CSW.   CSW will sign off. Please reconsult if needed.      Barriers to Discharge: Continued Medical Work up   Patient Goals and CMS Choice Patient states their goals for this hospitalization and ongoing recovery are:: to go home CMS Medicare.gov Compare Post Acute Care list provided to:: Patient Choice offered to / list presented to : Patient  Discharge Placement                       Discharge Plan and Services In-house Referral: Clinical Social Work   Post Acute Care Choice: Durable Medical Equipment, Home Health          DME Arranged: 3-N-1, Walker rolling DME Agency: [place] Date DME Agency Contacted: 11/26/19 Time DME Agency Contacted: 1200 Representative spoke with at DME Agency: Magda Paganini HH Arranged: PT [place] Date [place]: 11/26/19 Time [place]: 0100 Representative spoke with at [place]: Cassie  Social Determinants of Health ([place]) Interventions     Readmission Risk Interventions No flowsheet data found.
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
## 2019-11-30 ENCOUNTER — Encounter (HOSPITAL_COMMUNITY): Payer: Self-pay | Admitting: Student

## 2019-12-02 DIAGNOSIS — S82301A Unspecified fracture of lower end of right tibia, initial encounter for closed fracture: Secondary | ICD-10-CM

## 2020-02-15 ENCOUNTER — Other Ambulatory Visit (HOSPITAL_COMMUNITY): Payer: Self-pay | Admitting: *Deleted

## 2020-02-16 ENCOUNTER — Other Ambulatory Visit: Payer: Self-pay

## 2020-02-16 ENCOUNTER — Ambulatory Visit (HOSPITAL_COMMUNITY)
Admission: RE | Admit: 2020-02-16 | Discharge: 2020-02-16 | Disposition: A | Discharge status: Home / Self Care | Payer: Medicare PPO | Source: Ambulatory Visit | Attending: Endocrinology | Admitting: Endocrinology

## 2020-02-16 DIAGNOSIS — M810 Age-related osteoporosis without current pathological fracture: Secondary | ICD-10-CM | POA: Diagnosis not present

## 2020-02-16 MED ORDER — DENOSUMAB 60 MG/ML ~~LOC~~ SOSY
60.0000 mg | PREFILLED_SYRINGE | Freq: Once | SUBCUTANEOUS | Status: AC
  Administered 2020-02-16: 60 mg via SUBCUTANEOUS

## 2020-02-16 MED ORDER — DENOSUMAB 60 MG/ML ~~LOC~~ SOSY
PREFILLED_SYRINGE | SUBCUTANEOUS | Status: AC
  Filled 2020-02-16: qty 1

## 2020-02-17 ENCOUNTER — Ambulatory Visit: Payer: Medicare PPO | Attending: Student | Admitting: Physical Therapy

## 2020-02-17 ENCOUNTER — Other Ambulatory Visit: Payer: Self-pay

## 2020-02-17 ENCOUNTER — Encounter: Payer: Self-pay | Admitting: Physical Therapy

## 2020-02-17 DIAGNOSIS — M25571 Pain in right ankle and joints of right foot: Secondary | ICD-10-CM

## 2020-02-17 DIAGNOSIS — M25671 Stiffness of right ankle, not elsewhere classified: Secondary | ICD-10-CM | POA: Diagnosis present

## 2020-02-17 DIAGNOSIS — R262 Difficulty in walking, not elsewhere classified: Secondary | ICD-10-CM | POA: Diagnosis present
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
## 2020-02-22 ENCOUNTER — Other Ambulatory Visit: Payer: Self-pay

## 2020-02-22 ENCOUNTER — Encounter: Payer: Self-pay | Admitting: Physical Therapy

## 2020-02-22 ENCOUNTER — Ambulatory Visit: Payer: Medicare PPO | Admitting: Physical Therapy

## 2020-02-22 DIAGNOSIS — M25671 Stiffness of right ankle, not elsewhere classified: Secondary | ICD-10-CM

## 2020-02-22 DIAGNOSIS — R262 Difficulty in walking, not elsewhere classified: Secondary | ICD-10-CM

## 2020-02-22 DIAGNOSIS — M25571 Pain in right ankle and joints of right foot: Secondary | ICD-10-CM
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
## 2020-02-25 ENCOUNTER — Other Ambulatory Visit: Payer: Self-pay

## 2020-02-25 ENCOUNTER — Encounter: Payer: Self-pay | Admitting: Physical Therapy

## 2020-02-25 ENCOUNTER — Ambulatory Visit: Payer: Medicare PPO | Admitting: Physical Therapy

## 2020-02-25 DIAGNOSIS — R262 Difficulty in walking, not elsewhere classified: Secondary | ICD-10-CM

## 2020-02-25 DIAGNOSIS — M25571 Pain in right ankle and joints of right foot: Secondary | ICD-10-CM | POA: Diagnosis not present

## 2020-02-25 DIAGNOSIS — M25671 Stiffness of right ankle, not elsewhere classified: Secondary | ICD-10-CM

## 2020-02-25 NOTE — Therapy (Signed)
[facility] [place], [place], [address] Phone: [phone]   Fax:  [phone]  Physical Therapy Treatment  Patient Details  Name: Megan Boyle MRN: [id number] Date of Birth: 17-Aug-1941 Referring Provider (PT): Katha Hamming, MD   Encounter Date: 02/25/2020  PT End of Session - 02/25/20 0826    Visit Number  3    Number of Visits  24    Date for PT Re-Evaluation  05/11/20    Authorization Type  [employer];FOTO, Progress note every 10th visit, KX modifier at 15th visit    PT Start Time  0818    PT Stop Time  0918    PT Time Calculation (min)  60 min    Equipment Utilized During Treatment  Other (comment)   FWW, CAM boot   Activity Tolerance  Patient tolerated treatment well    Behavior During Therapy  [facility] for tasks assessed/performed       Past Medical History:  Diagnosis Date  . Chest pain   . Diverticulosis of colon (without mention of hemorrhage)     Past Surgical History:  Procedure Laterality Date  . ABDOMINAL HYSTERECTOMY    . APPENDECTOMY    . CHOLECYSTECTOMY    . OPEN REDUCTION INTERNAL FIXATION (ORIF) DISTAL RADIAL FRACTURE Left 03/31/2015   Procedure: OPEN REDUCTION INTERNAL FIXATION (ORIF) LEFT DISTAL RADIAL FRACTURE;  Surgeon: Daryll Brod, MD;  Location: [place];  Service: Orthopedics;  Laterality: Left;  . ORIF ANKLE FRACTURE Right 11/25/2019   Procedure: OPEN REDUCTION INTERNAL FIXATION (ORIF) RIGHT ANKLE FRACTURE;  Surgeon: Shona Needles, MD;  Location: [place];  Service: Orthopedics;  Laterality: Right;  Marland Kitchen VESICOVAGINAL FISTULA CLOSURE W/ TAH      There were no vitals filed for this visit.  Subjective Assessment - 02/25/20 0820    Subjective  COVID 19 screening performed on patient upon arrival. Patient reports some medial ankle burning.    Pertinent History  right pilon fracture ORIF 11/24/2020; osteoporosis    Limitations  Standing;Walking;House hold activities    How long can  you stand comfortably?  short periods    How long can you walk comfortably?  short periods with walker and boot.    Diagnostic tests  x-ray: routine healing    Patient Stated Goals  walk again, wear high heels    Currently in Pain?  No/denies         [facility] PT Assessment - 02/25/20 0001      Assessment   Medical Diagnosis  Fx right tibia, fx lower end of right tibia    Referring Provider (PT)  Katha Hamming, MD    Onset Date/Surgical Date  11/25/19    Next MD Visit  03/29/2020    Prior Therapy  home health PT      Precautions   Precautions  Other (comment)    Precaution Comments  able to wean out of CAM boot                   [facility] Adult PT Treatment/Exercise - 02/25/20 0001      Knee/Hip Exercises: Aerobic   Nustep  L2 x15 min LEs only      Knee/Hip Exercises: Seated   Long Arc Quad  Strengthening;Right;2 sets;10 reps;Weights    Long Arc Quad Weight  3 lbs.      Knee/Hip Exercises: Supine   Bridges  Strengthening;Both;20 reps    Straight Leg Raises  AROM;Right;20 reps    Other Supine  Knee/Hip Exercises  B hip clam red theraband x20 reps      Modalities   Modalities  Vasopneumatic      Vasopneumatic   Number Minutes Vasopneumatic   15 minutes    Vasopnuematic Location   Ankle    Vasopneumatic Pressure  Low    Vasopneumatic Temperature   34      Ankle Exercises: Seated   ABC's  1 rep    Heel Raises  Right;20 reps    Toe Raise  20 reps    Other Seated Ankle Exercises  Rockerboard x3 min                  PT Long Term Goals - 02/17/20 1412      PT LONG TERM GOAL #1   Title  Patient will be independent with HEP    Time  12    Period  Weeks    Status  New      PT LONG TERM GOAL #2   Title  Patient will demonstrate 8+ degrees of right ankle DF AROM to improve gait mechanics.    Time  12    Period  Weeks    Status  New      PT LONG TERM GOAL #3   Title  Patient will demonstrate 4+/5 right ankle MMT in all planes to improve stability  during functional tasks.    Time  12    Period  Weeks    Status  New      PT LONG TERM GOAL #4   Title  Patient will report ability to ambulate community distances with no AD and minimal gait deviations.    Time  12    Period  Weeks    Status  New      PT LONG TERM GOAL #5   Title  Patient will negotiate steps with reciprocating gait patten with one railing to safely access her community.    Time  12    Period  Weeks    Status  New            Plan - 02/25/20 [id number]    Clinical Impression Statement  Patient presented in clinic with reports of medial R ankle burning and swelling of R ankle. Patient guided through more LE strengthening focused exercises. Patient able to use Nustep with no UE support. More knee/hip strengthening completed as well to improve gait and standing stance. Norml vasopnuematic response noted following removal of the modality.    Personal Factors and Comorbidities  Age;Comorbidity 2    Comorbidities  Right tibia ORIF (right pilon fracture) 11/24/2020, Osteoporosis    Examination-Activity Limitations  Bathing;Locomotion Level;Stand;Stairs    Examination-Participation Restrictions  Driving;Meal Prep;Cleaning    Stability/Clinical Decision Making  Stable/Uncomplicated    Rehab Potential  Good    PT Frequency  2x / week    PT Duration  12 weeks    PT Treatment/Interventions  ADLs/Self Care Home Management;Cryotherapy;Electrical Stimulation;Iontophoresis 4mg /ml Dexamethasone;Neuromuscular re-education;Balance training;Therapeutic exercise;Therapeutic activities;Functional mobility training;Stair training;Gait training;Patient/family education;Manual techniques;Passive range of motion;Vasopneumatic Device;Taping    PT Next Visit Plan  WBAT without boot, nustep, ankle ROM in sitting and standing per tolerance, PROM and STW/M to calf and anterior tib to decrease muscle adhesions, modalities for pain relief    PT Home Exercise Plan  continue HEP provided by Home health  PT    Consulted and Agree with Plan of Care  Patient       Patient will benefit from  skilled therapeutic intervention in order to improve the following deficits and impairments:  Abnormal gait, Decreased activity tolerance, Decreased balance, Decreased strength, Decreased range of motion, Difficulty walking, Pain, Increased edema  Visit Diagnosis: Pain in right ankle and joints of right foot  Stiffness of right ankle, not elsewhere classified  Difficulty in walking, not elsewhere classified     Problem List Patient Active Problem List   Diagnosis Date Noted  . Closed fracture of distal end of right tibia 12/02/2019  . Closed right pilon fracture, initial encounter 11/24/2019  . Fracture of right ankle 11/24/2019  . DIVERTICULOSIS, SIGMOID COLON 03/01/2011  . CHEST PAIN 03/01/2011    Standley Brooking, PTA 02/25/2020, 9:37 AM  [facility] [address] [place], [place], [address] Phone: [phone]   Fax:  [phone]  Name: Megan Boyle MRN: [id number] Date of Birth: 13-Aug-1941

## 2020-02-29 ENCOUNTER — Ambulatory Visit: Payer: Medicare PPO | Attending: Student | Admitting: Physical Therapy

## 2020-02-29 ENCOUNTER — Other Ambulatory Visit: Payer: Self-pay

## 2020-02-29 DIAGNOSIS — R262 Difficulty in walking, not elsewhere classified: Secondary | ICD-10-CM | POA: Diagnosis present

## 2020-02-29 DIAGNOSIS — M25671 Stiffness of right ankle, not elsewhere classified: Secondary | ICD-10-CM

## 2020-02-29 DIAGNOSIS — M25571 Pain in right ankle and joints of right foot: Secondary | ICD-10-CM | POA: Insufficient documentation

## 2020-02-29 NOTE — Therapy (Addendum)
[facility] [place], [place], [address] Phone: [phone]   Fax:  [phone]  Physical Therapy Treatment  Patient Details  Name: Megan Boyle MRN: [id number] Date of Birth: Mar 15, 1941 Referring Provider (PT): Katha Hamming, MD   Encounter Date: 02/29/2020  PT End of Session - 02/29/20 0855    Visit Number  4    Number of Visits  24    Date for PT Re-Evaluation  05/11/20    Authorization Type  [employer];FOTO, Progress note every 10th visit, KX modifier at 15th visit    PT Start Time  0810    PT Stop Time  0903    PT Time Calculation (min)  53 min    Equipment Utilized During Treatment  Other (comment)    Activity Tolerance  Patient tolerated treatment well    Behavior During Therapy  [facility] for tasks assessed/performed       Past Medical History:  Diagnosis Date  . Chest pain   . Diverticulosis of colon (without mention of hemorrhage)     Past Surgical History:  Procedure Laterality Date  . ABDOMINAL HYSTERECTOMY    . APPENDECTOMY    . CHOLECYSTECTOMY    . OPEN REDUCTION INTERNAL FIXATION (ORIF) DISTAL RADIAL FRACTURE Left 03/31/2015   Procedure: OPEN REDUCTION INTERNAL FIXATION (ORIF) LEFT DISTAL RADIAL FRACTURE;  Surgeon: Daryll Brod, MD;  Location: [place];  Service: Orthopedics;  Laterality: Left;  . ORIF ANKLE FRACTURE Right 11/25/2019   Procedure: OPEN REDUCTION INTERNAL FIXATION (ORIF) RIGHT ANKLE FRACTURE;  Surgeon: Shona Needles, MD;  Location: [place];  Service: Orthopedics;  Laterality: Right;  Marland Kitchen VESICOVAGINAL FISTULA CLOSURE W/ TAH      There were no vitals filed for this visit.  Subjective Assessment - 02/29/20 0857    Subjective  COVID-19 screen performed prior to patient entering clinic.  Hurting a little more this morning. Could be weather.    Pertinent History  right pilon fracture ORIF 11/24/2020; osteoporosis    Limitations  Standing;Walking;House hold activities    How long can  you stand comfortably?  short periods    How long can you walk comfortably?  short periods with walker and boot.    Diagnostic tests  x-ray: routine healing    Patient Stated Goals  walk again, wear high heels    Currently in Pain?  Yes    Pain Score  3     Pain Location  Ankle    Pain Orientation  Right    Pain Descriptors / Indicators  Aching;Throbbing    Pain Type  Surgical pain    Pain Onset  More than a month ago                       [facility] Adult PT Treatment/Exercise - 02/29/20 0001      Exercises   Exercises  Knee/Hip      Knee/Hip Exercises: Aerobic   Nustep  Level 2 LE's only x 15 minutes.      Modalities   Modalities  Psychologist, educational Location  Rightht ankle.    Electrical Stimulation Action  Pre-mod.    Electrical Stimulation Parameters  80-150 Hz x 20 minutes.    Electrical Stimulation Goals  Pain      Vasopneumatic   Number Minutes Vasopneumatic   20 minutes    Vasopnuematic Location   --   Right ankle.  Vasopneumatic Pressure  Low      Manual Therapy   Manual Therapy  Soft tissue mobilization    Soft tissue mobilization  STW/M to affected right ankle and gentle range of motion x 9 minutes.                  PT Long Term Goals - 02/17/20 1412      PT LONG TERM GOAL #1   Title  Patient will be independent with HEP    Time  12    Period  Weeks    Status  New      PT LONG TERM GOAL #2   Title  Patient will demonstrate 8+ degrees of right ankle DF AROM to improve gait mechanics.    Time  12    Period  Weeks    Status  New      PT LONG TERM GOAL #3   Title  Patient will demonstrate 4+/5 right ankle MMT in all planes to improve stability during functional tasks.    Time  12    Period  Weeks    Status  New      PT LONG TERM GOAL #4   Title  Patient will report ability to ambulate community distances with no AD and minimal gait deviations.    Time   12    Period  Weeks    Status  New      PT LONG TERM GOAL #5   Title  Patient will negotiate steps with reciprocating gait patten with one railing to safely access her community.    Time  12    Period  Weeks    Status  New            Plan - 02/29/20 0934    Clinical Impression Statement  Patient doing well out of boot this morning.  She had some increased pain today perhaps related to rainy weather.  Her incisional site is hypersensitive to touch.  She enjoyed the electrical stimulation today.    Personal Factors and Comorbidities  Age;Comorbidity 2    Comorbidities  Right tibia ORIF (right pilon fracture) 11/24/2020, Osteoporosis    Examination-Activity Limitations  Bathing;Locomotion Level;Stand;Stairs    Examination-Participation Restrictions  Driving;Meal Prep;Cleaning    Stability/Clinical Decision Making  Stable/Uncomplicated    Rehab Potential  Good    PT Frequency  2x / week    PT Duration  12 weeks    PT Treatment/Interventions  ADLs/Self Care Home Management;Cryotherapy;Electrical Stimulation;Iontophoresis 4mg /ml Dexamethasone;Neuromuscular re-education;Balance training;Therapeutic exercise;Therapeutic activities;Functional mobility training;Stair training;Gait training;Patient/family education;Manual techniques;Passive range of motion;Vasopneumatic Device;Taping    PT Next Visit Plan  WBAT without boot, nustep, ankle ROM in sitting and standing per tolerance, PROM and STW/M to calf and anterior tib to decrease muscle adhesions, modalities for pain relief    PT Home Exercise Plan  continue HEP provided by Home health PT    Consulted and Agree with Plan of Care  Patient       Patient will benefit from skilled therapeutic intervention in order to improve the following deficits and impairments:  Abnormal gait, Decreased activity tolerance, Decreased balance, Decreased strength, Decreased range of motion, Difficulty walking, Pain, Increased edema  Visit Diagnosis: Pain in  right ankle and joints of right foot  Stiffness of right ankle, not elsewhere classified  Difficulty in walking, not elsewhere classified     Problem List Patient Active Problem List   Diagnosis Date Noted  . Closed fracture of distal end  of right tibia 12/02/2019  . Closed right pilon fracture, initial encounter 11/24/2019  . Fracture of right ankle 11/24/2019  . DIVERTICULOSIS, SIGMOID COLON 03/01/2011  . CHEST PAIN 03/01/2011    Trenton Passow, [place] MPT 02/29/2020, 10:02 AM  [facility] [address] [place], [place], [address] Phone: [phone]   Fax:  [phone]  Name: LOUELLEN BRALY MRN: [id number] Date of Birth: 1941-02-20

## 2020-03-03 ENCOUNTER — Encounter: Payer: Self-pay | Admitting: Physical Therapy

## 2020-03-03 ENCOUNTER — Ambulatory Visit: Payer: Medicare PPO | Admitting: Physical Therapy

## 2020-03-03 ENCOUNTER — Other Ambulatory Visit: Payer: Self-pay

## 2020-03-03 DIAGNOSIS — M25571 Pain in right ankle and joints of right foot: Secondary | ICD-10-CM

## 2020-03-03 DIAGNOSIS — R262 Difficulty in walking, not elsewhere classified: Secondary | ICD-10-CM

## 2020-03-03 DIAGNOSIS — M25671 Stiffness of right ankle, not elsewhere classified: Secondary | ICD-10-CM

## 2020-03-03 NOTE — Therapy (Signed)
[facility] [place], [place], [address] Phone: [phone]   Fax:  [phone]  Physical Therapy Treatment  Patient Details  Name: Megan Boyle MRN: [id number] Date of Birth: 07-Sep-1941 Referring Provider (PT): Katha Hamming, MD   Encounter Date: 03/03/2020  PT End of Session - 03/03/20 0853    Visit Number  5    Number of Visits  24    Date for PT Re-Evaluation  05/11/20    Authorization Type  [employer];FOTO, Progress note every 10th visit, KX modifier at 15th visit    PT Start Time  0815    PT Stop Time  0907    PT Time Calculation (min)  52 min    Activity Tolerance  Patient tolerated treatment well    Behavior During Therapy  [facility] for tasks assessed/performed       Past Medical History:  Diagnosis Date  . Chest pain   . Diverticulosis of colon (without mention of hemorrhage)     Past Surgical History:  Procedure Laterality Date  . ABDOMINAL HYSTERECTOMY    . APPENDECTOMY    . CHOLECYSTECTOMY    . OPEN REDUCTION INTERNAL FIXATION (ORIF) DISTAL RADIAL FRACTURE Left 03/31/2015   Procedure: OPEN REDUCTION INTERNAL FIXATION (ORIF) LEFT DISTAL RADIAL FRACTURE;  Surgeon: Daryll Brod, MD;  Location: [place];  Service: Orthopedics;  Laterality: Left;  . ORIF ANKLE FRACTURE Right 11/25/2019   Procedure: OPEN REDUCTION INTERNAL FIXATION (ORIF) RIGHT ANKLE FRACTURE;  Surgeon: Shona Needles, MD;  Location: [place];  Service: Orthopedics;  Laterality: Right;  Marland Kitchen VESICOVAGINAL FISTULA CLOSURE W/ TAH      There were no vitals filed for this visit.  Subjective Assessment - 03/03/20 0820    Subjective  COVID-19 screen performed prior to patient entering clinic.  Patient reported last treatment really helped.    Pertinent History  right pilon fracture ORIF 11/24/2020; osteoporosis    Limitations  Standing;Walking;House hold activities    How long can you stand comfortably?  short periods    How long can you walk  comfortably?  short periods with walker and boot.    Diagnostic tests  x-ray: routine healing    Patient Stated Goals  walk again, wear high heels    Currently in Pain?  Yes    Pain Score  3     Pain Location  Ankle    Pain Orientation  Right    Pain Descriptors / Indicators  Aching;Discomfort    Pain Type  Surgical pain    Pain Onset  More than a month ago    Pain Frequency  Constant    Aggravating Factors   up walking on LE    Pain Relieving Factors  at rest         [facility] PT Assessment - 03/03/20 0001      AROM   AROM Assessment Site  Ankle    Right/Left Ankle  Right    Right Ankle Dorsiflexion  6      PROM   Right Ankle Dorsiflexion  8                   [facility] Adult PT Treatment/Exercise - 03/03/20 0001      Knee/Hip Exercises: Aerobic   Nustep  Level 2 LE's only x 15 minutes.      Acupuncturist Location  Rightht ankle.    Child psychotherapist  Parameters  80-150hz  x11min    Electrical Stimulation Goals  Pain      Vasopneumatic   Number Minutes Vasopneumatic   15 minutes    Vasopnuematic Location   Ankle    Vasopneumatic Pressure  Low    Vasopneumatic Temperature   34 for edema      Manual Therapy   Manual Therapy  Soft tissue mobilization    Soft tissue mobilization  STW/M to affected right ankle and gentle range of motion to decrease pain and improve mobility                  PT Long Term Goals - 03/03/20 [id number]      PT LONG TERM GOAL #1   Title  Patient will be independent with HEP    Time  12    Period  Weeks    Status  On-going      PT LONG TERM GOAL #2   Title  Patient will demonstrate 8+ degrees of right ankle DF AROM to improve gait mechanics.    Time  12    Period  Weeks    Status  On-going   AROM 6 degrees DF 03/03/20     PT LONG TERM GOAL #3   Title  Patient will demonstrate 4+/5 right ankle MMT in all planes to improve stability during  functional tasks.    Time  12    Period  Weeks    Status  On-going   NT 03/03/20     PT LONG TERM GOAL #4   Title  Patient will report ability to ambulate community distances with no AD and minimal gait deviations.    Time  12    Period  Weeks    Status  On-going   Currently using FWW 03/03/20     PT LONG TERM GOAL #5   Title  Patient will negotiate steps with reciprocating gait patten with one railing to safely access her community.    Time  12    Period  Weeks    Status  On-going            Plan - 03/03/20 0854    Clinical Impression Statement  Patient tolerated treatment well today. Patient reported relief after last treatment and reported less swelling for several hours after treatment. Today continued with treatment for genlte strength/ROM followed by manual PROM and STW to decrease pain/improve mobility/decrease swelling with modalities. Patient improved DF active and passive ROM today. Patient goals are progressing.    Personal Factors and Comorbidities  Age;Comorbidity 2    Comorbidities  Right tibia ORIF (right pilon fracture) 11/24/2020, Osteoporosis    Examination-Activity Limitations  Bathing;Locomotion Level;Stand;Stairs    Examination-Participation Restrictions  Driving;Meal Prep;Cleaning    Stability/Clinical Decision Making  Stable/Uncomplicated    Rehab Potential  Good    PT Frequency  2x / week    PT Duration  12 weeks    PT Treatment/Interventions  ADLs/Self Care Home Management;Cryotherapy;Electrical Stimulation;Iontophoresis 4mg /ml Dexamethasone;Neuromuscular re-education;Balance training;Therapeutic exercise;Therapeutic activities;Functional mobility training;Stair training;Gait training;Patient/family education;Manual techniques;Passive range of motion;Vasopneumatic Device;Taping    PT Next Visit Plan  cont with POC for WBAT without boot, nustep, ankle ROM in sitting and standing per tolerance, PROM and STW/M to calf and anterior tib to decrease muscle  adhesions, modalities for pain relief    Consulted and Agree with Plan of Care  Patient       Patient will benefit from skilled therapeutic intervention in order to improve the following  deficits and impairments:  Abnormal gait, Decreased activity tolerance, Decreased balance, Decreased strength, Decreased range of motion, Difficulty walking, Pain, Increased edema  Visit Diagnosis: Pain in right ankle and joints of right foot  Stiffness of right ankle, not elsewhere classified  Difficulty in walking, not elsewhere classified     Problem List Patient Active Problem List   Diagnosis Date Noted  . Closed fracture of distal end of right tibia 12/02/2019  . Closed right pilon fracture, initial encounter 11/24/2019  . Fracture of right ankle 11/24/2019  . DIVERTICULOSIS, SIGMOID COLON 03/01/2011  . CHEST PAIN 03/01/2011    Phillips Climes, PTA 03/03/2020, 9:10 AM  [facility] [place], [place], [address] Phone: [phone]   Fax:  [phone]  Name: LATISA ZURICK MRN: [id number] Date of Birth: 04-19-1941

## 2020-03-07 ENCOUNTER — Ambulatory Visit: Payer: Medicare PPO | Admitting: Physical Therapy

## 2020-03-07 ENCOUNTER — Other Ambulatory Visit: Payer: Self-pay

## 2020-03-07 ENCOUNTER — Encounter: Payer: Self-pay | Admitting: Physical Therapy

## 2020-03-07 DIAGNOSIS — M25571 Pain in right ankle and joints of right foot: Secondary | ICD-10-CM | POA: Diagnosis not present

## 2020-03-07 DIAGNOSIS — M25671 Stiffness of right ankle, not elsewhere classified: Secondary | ICD-10-CM

## 2020-03-07 DIAGNOSIS — R262 Difficulty in walking, not elsewhere classified: Secondary | ICD-10-CM

## 2020-03-07 NOTE — Therapy (Signed)
[facility] [place], [place], [address] Phone: [phone]   Fax:  [phone]  Physical Therapy Treatment  Patient Details  Name: Megan Boyle MRN: [id number] Date of Birth: 09/20/1941 Referring Provider (PT): Katha Hamming, MD   Encounter Date: 03/07/2020  PT End of Session - 03/07/20 0819    Visit Number  6    Number of Visits  24    Date for PT Re-Evaluation  05/11/20    Authorization Type  [employer];FOTO, Progress note every 10th visit, KX modifier at 15th visit    PT Start Time  0816    PT Stop Time  0907    PT Time Calculation (min)  51 min    Equipment Utilized During Treatment  Other (comment)   FWW   Activity Tolerance  Patient tolerated treatment well    Behavior During Therapy  [facility] for tasks assessed/performed       Past Medical History:  Diagnosis Date  . Chest pain   . Diverticulosis of colon (without mention of hemorrhage)     Past Surgical History:  Procedure Laterality Date  . ABDOMINAL HYSTERECTOMY    . APPENDECTOMY    . CHOLECYSTECTOMY    . OPEN REDUCTION INTERNAL FIXATION (ORIF) DISTAL RADIAL FRACTURE Left 03/31/2015   Procedure: OPEN REDUCTION INTERNAL FIXATION (ORIF) LEFT DISTAL RADIAL FRACTURE;  Surgeon: Daryll Brod, MD;  Location: [place];  Service: Orthopedics;  Laterality: Left;  . ORIF ANKLE FRACTURE Right 11/25/2019   Procedure: OPEN REDUCTION INTERNAL FIXATION (ORIF) RIGHT ANKLE FRACTURE;  Surgeon: Shona Needles, MD;  Location: [place];  Service: Orthopedics;  Laterality: Right;  Marland Kitchen VESICOVAGINAL FISTULA CLOSURE W/ TAH      There were no vitals filed for this visit.  Subjective Assessment - 03/07/20 0817    Subjective  COVID-19 screen performed prior to patient entering clinic.  Patient reports increased fatigue in R ankle but has also recently gotten the first COVID vaccine. Reports that walking    Pertinent History  right pilon fracture ORIF 11/24/2020; osteoporosis     Limitations  Standing;Walking;House hold activities    How long can you stand comfortably?  short periods    How long can you walk comfortably?  short periods with walker and boot.    Diagnostic tests  x-ray: routine healing    Patient Stated Goals  walk again, wear high heels    Currently in Pain?  Yes    Pain Score  3     Pain Location  Ankle    Pain Orientation  Right    Pain Descriptors / Indicators  Aching    Pain Type  Surgical pain    Pain Onset  More than a month ago    Pain Frequency  Constant         [facility] PT Assessment - 03/07/20 0001      Assessment   Medical Diagnosis  Fx right tibia, fx lower end of right tibia    Referring Provider (PT)  Katha Hamming, MD    Onset Date/Surgical Date  11/25/19    Next MD Visit  03/29/2020    Prior Therapy  home health PT      Precautions   Precautions  Other (comment)    Precaution Comments  able to wean out of CAM boot                   [facility] Adult PT Treatment/Exercise - 03/07/20 0001  Modalities   Modalities  Psychologist, educational Location  R ankle    Electrical Stimulation Action  Pre-Mod    Electrical Stimulation Parameters  80-150 hz x15 min    Electrical Stimulation Goals  Pain;Edema      Vasopneumatic   Number Minutes Vasopneumatic   15 minutes    Vasopnuematic Location   Ankle    Vasopneumatic Pressure  Low    Vasopneumatic Temperature   34 for edema      Manual Therapy   Manual Therapy  Soft tissue mobilization    Soft tissue mobilization  STW to RLE, calf, anterior tib to reduce TPR and reduce edema      Ankle Exercises: Aerobic   Nustep  L2 x15 min LEs only      Ankle Exercises: Seated   Other Seated Ankle Exercises  Rockerboard x2 min                  PT Long Term Goals - 03/07/20 [id number]      PT LONG TERM GOAL #1   Title  Patient will be independent with HEP    Time  12    Period  Weeks    Status   Achieved      PT LONG TERM GOAL #2   Title  Patient will demonstrate 8+ degrees of right ankle DF AROM to improve gait mechanics.    Time  12    Period  Weeks    Status  On-going   AROM 6 degrees DF 03/03/20     PT LONG TERM GOAL #3   Title  Patient will demonstrate 4+/5 right ankle MMT in all planes to improve stability during functional tasks.    Time  12    Period  Weeks    Status  On-going   NT 03/03/20     PT LONG TERM GOAL #4   Title  Patient will report ability to ambulate community distances with no AD and minimal gait deviations.    Time  12    Period  Weeks    Status  On-going   Currently using FWW 03/03/20     PT LONG TERM GOAL #5   Title  Patient will negotiate steps with reciprocating gait patten with one railing to safely access her community.    Time  12    Period  Weeks    Status  On-going            Plan - 03/07/20 0915    Clinical Impression Statement  Patient presented in clinic with a low level R ankle aching. Patient has reported great improvement with RLE edema since beginning edema massage. TP noted in mid R gastroc but released with TPR. Patient very compliant wiht HEP at home. Normal modalities response noted following removal of the modalities.    Personal Factors and Comorbidities  Age;Comorbidity 2    Comorbidities  Right tibia ORIF (right pilon fracture) 11/24/2020, Osteoporosis    Examination-Activity Limitations  Bathing;Locomotion Level;Stand;Stairs    Examination-Participation Restrictions  Driving;Meal Prep;Cleaning    Stability/Clinical Decision Making  Stable/Uncomplicated    Rehab Potential  Good    PT Frequency  2x / week    PT Duration  12 weeks    PT Treatment/Interventions  ADLs/Self Care Home Management;Cryotherapy;Electrical Stimulation;Iontophoresis 4mg /ml Dexamethasone;Neuromuscular re-education;Balance training;Therapeutic exercise;Therapeutic activities;Functional mobility training;Stair training;Gait training;Patient/family  education;Manual techniques;Passive range of motion;Vasopneumatic Device;Taping    PT Next Visit  Plan  cont with POC for WBAT without boot, nustep, ankle ROM in sitting and standing per tolerance, PROM and STW/M to calf and anterior tib to decrease muscle adhesions, modalities for pain relief    PT Home Exercise Plan  continue HEP provided by Home health PT    Consulted and Agree with Plan of Care  Patient       Patient will benefit from skilled therapeutic intervention in order to improve the following deficits and impairments:  Abnormal gait, Decreased activity tolerance, Decreased balance, Decreased strength, Decreased range of motion, Difficulty walking, Pain, Increased edema  Visit Diagnosis: Pain in right ankle and joints of right foot  Stiffness of right ankle, not elsewhere classified  Difficulty in walking, not elsewhere classified     Problem List Patient Active Problem List   Diagnosis Date Noted  . Closed fracture of distal end of right tibia 12/02/2019  . Closed right pilon fracture, initial encounter 11/24/2019  . Fracture of right ankle 11/24/2019  . DIVERTICULOSIS, SIGMOID COLON 03/01/2011  . CHEST PAIN 03/01/2011    Standley Brooking, PTA 03/07/2020, 9:23 AM  [facility] [address] [place], [place], [address] Phone: [phone]   Fax:  [phone]  Name: Megan Boyle MRN: [id number] Date of Birth: 1941-04-18

## 2020-03-09 ENCOUNTER — Other Ambulatory Visit: Payer: Self-pay

## 2020-03-09 ENCOUNTER — Encounter: Payer: Self-pay | Admitting: Physical Therapy

## 2020-03-09 ENCOUNTER — Ambulatory Visit: Payer: Medicare PPO | Admitting: Physical Therapy

## 2020-03-09 DIAGNOSIS — M25571 Pain in right ankle and joints of right foot: Secondary | ICD-10-CM

## 2020-03-09 DIAGNOSIS — M25671 Stiffness of right ankle, not elsewhere classified: Secondary | ICD-10-CM

## 2020-03-09 DIAGNOSIS — R262 Difficulty in walking, not elsewhere classified: Secondary | ICD-10-CM

## 2020-03-09 NOTE — Therapy (Signed)
[facility] [place], [place], [address] Phone: [phone]   Fax:  [phone]  Physical Therapy Treatment  Patient Details  Name: Megan Boyle MRN: [id number] Date of Birth: 1941-07-05 Referring Provider (PT): Katha Hamming, MD   Encounter Date: 03/09/2020  PT End of Session - 03/09/20 1122    Visit Number  7    Number of Visits  24    Date for PT Re-Evaluation  05/11/20    Authorization Type  [employer];FOTO, Progress note every 10th visit, KX modifier at 15th visit    PT Start Time  1116    PT Stop Time  1208    PT Time Calculation (min)  52 min    Equipment Utilized During Treatment  Other (comment)    Activity Tolerance  Patient tolerated treatment well    Behavior During Therapy  [facility] for tasks assessed/performed       Past Medical History:  Diagnosis Date  . Chest pain   . Diverticulosis of colon (without mention of hemorrhage)     Past Surgical History:  Procedure Laterality Date  . ABDOMINAL HYSTERECTOMY    . APPENDECTOMY    . CHOLECYSTECTOMY    . OPEN REDUCTION INTERNAL FIXATION (ORIF) DISTAL RADIAL FRACTURE Left 03/31/2015   Procedure: OPEN REDUCTION INTERNAL FIXATION (ORIF) LEFT DISTAL RADIAL FRACTURE;  Surgeon: Daryll Brod, MD;  Location: [place];  Service: Orthopedics;  Laterality: Left;  . ORIF ANKLE FRACTURE Right 11/25/2019   Procedure: OPEN REDUCTION INTERNAL FIXATION (ORIF) RIGHT ANKLE FRACTURE;  Surgeon: Shona Needles, MD;  Location: [place];  Service: Orthopedics;  Laterality: Right;  Marland Kitchen VESICOVAGINAL FISTULA CLOSURE W/ TAH      There were no vitals filed for this visit.  Subjective Assessment - 03/09/20 1121    Subjective  COVID-19 screen performed prior to patient entering clinic. Patient arrives with ongoing soreness to right ankle and pain in the posterior aspect.    Pertinent History  right pilon fracture ORIF 11/24/2020; osteoporosis    Limitations  Standing;Walking;House  hold activities    How long can you stand comfortably?  short periods    How long can you walk comfortably?  short periods with walker and boot.    Diagnostic tests  x-ray: routine healing    Patient Stated Goals  walk again, wear high heels    Currently in Pain?  Yes   did not provide number on pain scale        [facility] PT Assessment - 03/09/20 0001      Assessment   Medical Diagnosis  Fx right tibia, fx lower end of right tibia    Referring Provider (PT)  Katha Hamming, MD    Onset Date/Surgical Date  11/25/19    Next MD Visit  03/29/2020    Prior Therapy  home health PT      Precautions   Precautions  Other (comment)    Precaution Comments  able to wean out of CAM boot                   [facility] Adult PT Treatment/Exercise - 03/09/20 0001      Modalities   Modalities  Electrical Stimulation;Vasopneumatic      Electrical Stimulation   Electrical Stimulation Location  R ankle    Electrical Stimulation Action  pre-mod    Electrical Stimulation Parameters  80-150 hz x15 mins    Electrical Stimulation Goals  Pain;Edema      Vasopneumatic  Number Minutes Vasopneumatic   15 minutes    Vasopnuematic Location   Ankle    Vasopneumatic Pressure  Low    Vasopneumatic Temperature   34 for edema      Manual Therapy   Manual Therapy  Soft tissue mobilization    Soft tissue mobilization  STW and edema massage to RLE, calf, anterior tib to reduce TPR and reduce edema      Ankle Exercises: Aerobic   Nustep  L3 x15 min LEs only      Ankle Exercises: Seated   Other Seated Ankle Exercises  Rockerboard x2 min each AP and lateral                  PT Long Term Goals - 03/07/20 [id number]      PT LONG TERM GOAL #1   Title  Patient will be independent with HEP    Time  12    Period  Weeks    Status  Achieved      PT LONG TERM GOAL #2   Title  Patient will demonstrate 8+ degrees of right ankle DF AROM to improve gait mechanics.    Time  12    Period  Weeks     Status  On-going   AROM 6 degrees DF 03/03/20     PT LONG TERM GOAL #3   Title  Patient will demonstrate 4+/5 right ankle MMT in all planes to improve stability during functional tasks.    Time  12    Period  Weeks    Status  On-going   NT 03/03/20     PT LONG TERM GOAL #4   Title  Patient will report ability to ambulate community distances with no AD and minimal gait deviations.    Time  12    Period  Weeks    Status  On-going   Currently using FWW 03/03/20     PT LONG TERM GOAL #5   Title  Patient will negotiate steps with reciprocating gait patten with one railing to safely access her community.    Time  12    Period  Weeks    Status  On-going            Plan - 03/09/20 1221    Clinical Impression Statement  Patient responded well to therapy session and reports an improvement with edema and ankle ROM since start of PT. Patient and PT discussed slow progression to standing TEs. Patient reported agreement. No adverse affects upon removal of modalities.    Personal Factors and Comorbidities  Age;Comorbidity 2    Comorbidities  Right tibia ORIF (right pilon fracture) 11/24/2020, Osteoporosis    Examination-Activity Limitations  Bathing;Locomotion Level;Stand;Stairs    Examination-Participation Restrictions  Driving;Meal Prep;Cleaning    Stability/Clinical Decision Making  Stable/Uncomplicated    Clinical Decision Making  Low    Rehab Potential  Good    PT Frequency  2x / week    PT Duration  12 weeks    PT Treatment/Interventions  ADLs/Self Care Home Management;Cryotherapy;Electrical Stimulation;Iontophoresis 4mg /ml Dexamethasone;Neuromuscular re-education;Balance training;Therapeutic exercise;Therapeutic activities;Functional mobility training;Stair training;Gait training;Patient/family education;Manual techniques;Passive range of motion;Vasopneumatic Device;Taping    PT Next Visit Plan  cont with POC for WBAT without boot, nustep, ankle ROM in sitting and standing per tolerance,  PROM and STW/M to calf and anterior tib to decrease muscle adhesions, modalities for pain relief    PT Home Exercise Plan  continue HEP provided by Home health PT    Consulted  and Agree with Plan of Care  Patient       Patient will benefit from skilled therapeutic intervention in order to improve the following deficits and impairments:  Abnormal gait, Decreased activity tolerance, Decreased balance, Decreased strength, Decreased range of motion, Difficulty walking, Pain, Increased edema  Visit Diagnosis: Pain in right ankle and joints of right foot  Stiffness of right ankle, not elsewhere classified  Difficulty in walking, not elsewhere classified     Problem List Patient Active Problem List   Diagnosis Date Noted  . Closed fracture of distal end of right tibia 12/02/2019  . Closed right pilon fracture, initial encounter 11/24/2019  . Fracture of right ankle 11/24/2019  . DIVERTICULOSIS, SIGMOID COLON 03/01/2011  . CHEST PAIN 03/01/2011    Gabriela Eves, PT, DPT 03/09/2020, 12:32 PM  [facility] [address] [place], [place], [address] Phone: [phone]   Fax:  [phone]  Name: Megan Boyle MRN: [id number] Date of Birth: April 04, 1941

## 2020-03-10 ENCOUNTER — Encounter: Payer: Medicare PPO | Admitting: Physical Therapy

## 2020-03-14 ENCOUNTER — Ambulatory Visit: Payer: Medicare PPO | Admitting: Physical Therapy

## 2020-03-14 ENCOUNTER — Other Ambulatory Visit: Payer: Self-pay

## 2020-03-14 ENCOUNTER — Encounter: Payer: Self-pay | Admitting: Physical Therapy

## 2020-03-14 DIAGNOSIS — M25671 Stiffness of right ankle, not elsewhere classified: Secondary | ICD-10-CM

## 2020-03-14 DIAGNOSIS — M25571 Pain in right ankle and joints of right foot: Secondary | ICD-10-CM

## 2020-03-14 DIAGNOSIS — R262 Difficulty in walking, not elsewhere classified: Secondary | ICD-10-CM

## 2020-03-14 NOTE — Therapy (Signed)
[facility] [place], [place], [address] Phone: [phone]   Fax:  [phone]  Physical Therapy Treatment  Patient Details  Name: Megan Boyle MRN: [id number] Date of Birth: 03-20-1941 Referring Provider (PT): Katha Hamming, MD   Encounter Date: 03/14/2020  PT End of Session - 03/14/20 0823    Visit Number  8    Number of Visits  24    Date for PT Re-Evaluation  05/11/20    Authorization Type  [employer];FOTO, Progress note every 10th visit, KX modifier at 15th visit    PT Start Time  0817    PT Stop Time  0906    PT Time Calculation (min)  49 min    Equipment Utilized During Treatment  Other (comment)   FWW   Activity Tolerance  Patient tolerated treatment well    Behavior During Therapy  [facility] for tasks assessed/performed       Past Medical History:  Diagnosis Date  . Chest pain   . Diverticulosis of colon (without mention of hemorrhage)     Past Surgical History:  Procedure Laterality Date  . ABDOMINAL HYSTERECTOMY    . APPENDECTOMY    . CHOLECYSTECTOMY    . OPEN REDUCTION INTERNAL FIXATION (ORIF) DISTAL RADIAL FRACTURE Left 03/31/2015   Procedure: OPEN REDUCTION INTERNAL FIXATION (ORIF) LEFT DISTAL RADIAL FRACTURE;  Surgeon: Daryll Brod, MD;  Location: [place];  Service: Orthopedics;  Laterality: Left;  . ORIF ANKLE FRACTURE Right 11/25/2019   Procedure: OPEN REDUCTION INTERNAL FIXATION (ORIF) RIGHT ANKLE FRACTURE;  Surgeon: Shona Needles, MD;  Location: [place];  Service: Orthopedics;  Laterality: Right;  Marland Kitchen VESICOVAGINAL FISTULA CLOSURE W/ TAH      There were no vitals filed for this visit.  Subjective Assessment - 03/14/20 0820    Subjective  COVID-19 screen performed prior to patient entering clinic. Patient reports that she still has some tightness of R calf and pins and needles along medial malleoli. Patient reports that she also walked across the carpet without her boot and walker this  weekend and did fairly well    Pertinent History  right pilon fracture ORIF 11/24/2020; osteoporosis    Limitations  Standing;Walking;House hold activities    How long can you stand comfortably?  short periods    How long can you walk comfortably?  short periods with walker and boot.    Diagnostic tests  x-ray: routine healing    Patient Stated Goals  walk again, wear high heels    Currently in Pain?  No/denies         [facility] PT Assessment - 03/14/20 0001      Assessment   Medical Diagnosis  Fx right tibia, fx lower end of right tibia    Referring Provider (PT)  Katha Hamming, MD    Onset Date/Surgical Date  11/25/19    Next MD Visit  03/29/2020    Prior Therapy  home health PT      Precautions   Precautions  Other (comment)    Precaution Comments  able to wean out of CAM boot                   [facility] Adult PT Treatment/Exercise - 03/14/20 0001      Modalities   Modalities  Electrical Stimulation;Vasopneumatic      Electrical Stimulation   Electrical Stimulation Location  R ankle    Electrical Stimulation Action  Pre-Mod    Electrical Stimulation Parameters  80-150 hz x15 min    Electrical Stimulation Goals  Edema      Vasopneumatic   Number Minutes Vasopneumatic   15 minutes    Vasopnuematic Location   Ankle    Vasopneumatic Pressure  Medium    Vasopneumatic Temperature   34 for edema      Manual Therapy   Manual Therapy  Soft tissue mobilization    Soft tissue mobilization  STW and edema massage to RLE, calf, anterior tib to reduce TPR and reduce edema      Ankle Exercises: Aerobic   Nustep  L3 x15 min LEs only      Ankle Exercises: Standing   Rocker Board  3 minutes   for calf stretching                 PT Long Term Goals - 03/07/20 [id number]      PT LONG TERM GOAL #1   Title  Patient will be independent with HEP    Time  12    Period  Weeks    Status  Achieved      PT LONG TERM GOAL #2   Title  Patient will demonstrate 8+ degrees of  right ankle DF AROM to improve gait mechanics.    Time  12    Period  Weeks    Status  On-going   AROM 6 degrees DF 03/03/20     PT LONG TERM GOAL #3   Title  Patient will demonstrate 4+/5 right ankle MMT in all planes to improve stability during functional tasks.    Time  12    Period  Weeks    Status  On-going   NT 03/03/20     PT LONG TERM GOAL #4   Title  Patient will report ability to ambulate community distances with no AD and minimal gait deviations.    Time  12    Period  Weeks    Status  On-going   Currently using FWW 03/03/20     PT LONG TERM GOAL #5   Title  Patient will negotiate steps with reciprocating gait patten with one railing to safely access her community.    Time  12    Period  Weeks    Status  On-going            Plan - 03/14/20 0905    Clinical Impression Statement  Patient presented in clinic with no complaints of R ankle. Patient reports walking around her home this weekend without her boot or FWW and did fairly well with the activity. Patient uses AD during nighttime when going to the bathroom. Patient reports being very vigilant about HEP twice daily. Moderate tightness palpable in R calf in deep tissue. Normal modalities response noted following removal of the modalities. Patient moreso carrying FWW but able to ambulate WNL and no compensatory strategies.    Personal Factors and Comorbidities  Age;Comorbidity 2    Comorbidities  Right tibia ORIF (right pilon fracture) 11/24/2020, Osteoporosis    Examination-Activity Limitations  Bathing;Locomotion Level;Stand;Stairs    Examination-Participation Restrictions  Driving;Meal Prep;Cleaning    Stability/Clinical Decision Making  Stable/Uncomplicated    Rehab Potential  Good    PT Frequency  2x / week    PT Duration  12 weeks    PT Treatment/Interventions  ADLs/Self Care Home Management;Cryotherapy;Electrical Stimulation;Iontophoresis 4mg /ml Dexamethasone;Neuromuscular re-education;Balance  training;Therapeutic exercise;Therapeutic activities;Functional mobility training;Stair training;Gait training;Patient/family education;Manual techniques;Passive range of motion;Vasopneumatic Device;Taping    PT Next Visit Plan  cont with POC for WBAT without boot, nustep, ankle ROM in sitting and standing per tolerance, PROM and STW/M to calf and anterior tib to decrease muscle adhesions, modalities for pain relief    PT Home Exercise Plan  continue HEP provided by Home health PT    Consulted and Agree with Plan of Care  Patient       Patient will benefit from skilled therapeutic intervention in order to improve the following deficits and impairments:  Abnormal gait, Decreased activity tolerance, Decreased balance, Decreased strength, Decreased range of motion, Difficulty walking, Pain, Increased edema  Visit Diagnosis: Pain in right ankle and joints of right foot  Stiffness of right ankle, not elsewhere classified  Difficulty in walking, not elsewhere classified     Problem List Patient Active Problem List   Diagnosis Date Noted  . Closed fracture of distal end of right tibia 12/02/2019  . Closed right pilon fracture, initial encounter 11/24/2019  . Fracture of right ankle 11/24/2019  . DIVERTICULOSIS, SIGMOID COLON 03/01/2011  . CHEST PAIN 03/01/2011    Standley Brooking, PTA 03/14/2020, 9:13 AM  [facility] [address] [place], [place], [address] Phone: [phone]   Fax:  [phone]  Name: Megan Boyle MRN: [id number] Date of Birth: Mar 17, 1941

## 2020-03-17 ENCOUNTER — Encounter: Payer: Self-pay | Admitting: Physical Therapy

## 2020-03-17 ENCOUNTER — Ambulatory Visit: Payer: Medicare PPO | Admitting: Physical Therapy

## 2020-03-17 ENCOUNTER — Other Ambulatory Visit: Payer: Self-pay

## 2020-03-17 DIAGNOSIS — M25671 Stiffness of right ankle, not elsewhere classified: Secondary | ICD-10-CM

## 2020-03-17 DIAGNOSIS — M25571 Pain in right ankle and joints of right foot: Secondary | ICD-10-CM | POA: Diagnosis not present

## 2020-03-17 DIAGNOSIS — R262 Difficulty in walking, not elsewhere classified: Secondary | ICD-10-CM

## 2020-03-17 NOTE — Therapy (Signed)
[facility] [place], [place], [address] Phone: [phone]   Fax:  [phone]  Physical Therapy Treatment  Patient Details  Name: Megan Boyle MRN: [id number] Date of Birth: [birth year] Referring Provider (PT): Katha Hamming, MD   Encounter Date: 03/17/2020  PT End of Session - 03/17/20 [id number]    Visit Number  9    Number of Visits  24    Date for PT Re-Evaluation  05/11/20    Authorization Type  [employer];FOTO, Progress note every 10th visit, KX modifier at 15th visit    PT Start Time  0817    PT Stop Time  0902    PT Time Calculation (min)  45 min    Equipment Utilized During Treatment  Other (comment)   FWW   Activity Tolerance  Patient tolerated treatment well    Behavior During Therapy  [facility] for tasks assessed/performed       Past Medical History:  Diagnosis Date  . Chest pain   . Diverticulosis of colon (without mention of hemorrhage)     Past Surgical History:  Procedure Laterality Date  . ABDOMINAL HYSTERECTOMY    . APPENDECTOMY    . CHOLECYSTECTOMY    . OPEN REDUCTION INTERNAL FIXATION (ORIF) DISTAL RADIAL FRACTURE Left 03/31/2015   Procedure: OPEN REDUCTION INTERNAL FIXATION (ORIF) LEFT DISTAL RADIAL FRACTURE;  Surgeon: Daryll Brod, MD;  Location: [place];  Service: Orthopedics;  Laterality: Left;  . ORIF ANKLE FRACTURE Right 11/25/2019   Procedure: OPEN REDUCTION INTERNAL FIXATION (ORIF) RIGHT ANKLE FRACTURE;  Surgeon: Shona Needles, MD;  Location: [place];  Service: Orthopedics;  Laterality: Right;  Marland Kitchen VESICOVAGINAL FISTULA CLOSURE W/ TAH      There were no vitals filed for this visit.  Subjective Assessment - 03/17/20 0819    Subjective  COVID 19 screening performed on patient upon arrival. Reports that she walked around Tuesday without FWW in tennis shoe and was very sore afterwards. Reported resting a lot yesterday.    Pertinent History  right pilon fracture ORIF 11/24/2020;  osteoporosis    Limitations  Standing;Walking;House hold activities    How long can you stand comfortably?  short periods    How long can you walk comfortably?  short periods with walker and boot.    Diagnostic tests  x-ray: routine healing    Patient Stated Goals  walk again, wear high heels    Currently in Pain?  Yes    Pain Score  3     Pain Location  Calf    Pain Orientation  Right    Pain Descriptors / Indicators  Sore    Pain Type  Surgical pain    Pain Onset  More than a month ago    Pain Frequency  Constant         [facility] PT Assessment - 03/17/20 0001      Assessment   Medical Diagnosis  Fx right tibia, fx lower end of right tibia    Referring Provider (PT)  Katha Hamming, MD    Onset Date/Surgical Date  11/25/19    Next MD Visit  03/29/2020    Prior Therapy  home health PT      Precautions   Precautions  Other (comment)    Precaution Comments  able to wean out of CAM boot                   [facility] Adult PT Treatment/Exercise - 03/17/20 0001  Modalities   Modalities  Psychologist, educational Location  R ankle    Electrical Stimulation Action  Pre-mod    Electrical Stimulation Parameters  80-150 hz x10 min    Electrical Stimulation Goals  Pain;Edema      Vasopneumatic   Number Minutes Vasopneumatic   10 minutes    Vasopnuematic Location   Ankle    Vasopneumatic Pressure  Low    Vasopneumatic Temperature   34 for edema      Manual Therapy   Manual Therapy  Soft tissue mobilization    Soft tissue mobilization  STW and edema massage to RLE, peroneals. gastroc, posterior tib to reduce TPR and reduce edema      Ankle Exercises: Aerobic   Nustep  L4 x16 min                  PT Long Term Goals - 03/07/20 [id number]      PT LONG TERM GOAL #1   Title  Patient will be independent with HEP    Time  12    Period  Weeks    Status  Achieved      PT LONG TERM GOAL #2   Title   Patient will demonstrate 8+ degrees of right ankle DF AROM to improve gait mechanics.    Time  12    Period  Weeks    Status  On-going   AROM 6 degrees DF 03/03/20     PT LONG TERM GOAL #3   Title  Patient will demonstrate 4+/5 right ankle MMT in all planes to improve stability during functional tasks.    Time  12    Period  Weeks    Status  On-going   NT 03/03/20     PT LONG TERM GOAL #4   Title  Patient will report ability to ambulate community distances with no AD and minimal gait deviations.    Time  12    Period  Weeks    Status  On-going   Currently using FWW 03/03/20     PT LONG TERM GOAL #5   Title  Patient will negotiate steps with reciprocating gait patten with one railing to safely access her community.    Time  12    Period  Weeks    Status  On-going            Plan - 03/17/20 0933    Clinical Impression Statement  Patient presented in clinic with reports of minimal R calf soreness which may be a result of patient attempting to ambulate without AD. Patient reports tightness across dorsal aspect of R foot with R ankle inversion. Minimal TP noted in R gastroc, peroneals, posterior tibialis during edema massage and STW. Normal modalities response noted following removal of the modalities.    Personal Factors and Comorbidities  Age;Comorbidity 2    Comorbidities  Right tibia ORIF (right pilon fracture) 11/24/2020, Osteoporosis    Examination-Activity Limitations  Bathing;Locomotion Level;Stand;Stairs    Examination-Participation Restrictions  Driving;Meal Prep;Cleaning    Stability/Clinical Decision Making  Stable/Uncomplicated    Rehab Potential  Good    PT Frequency  2x / week    PT Duration  12 weeks    PT Treatment/Interventions  ADLs/Self Care Home Management;Cryotherapy;Electrical Stimulation;Iontophoresis 4mg /ml Dexamethasone;Neuromuscular re-education;Balance training;Therapeutic exercise;Therapeutic activities;Functional mobility training;Stair training;Gait  training;Patient/family education;Manual techniques;Passive range of motion;Vasopneumatic Device;Taping    PT Next Visit Plan  Continue with edema massage  and STW of R calf.    PT Home Exercise Plan  continue HEP provided by Home health PT    Consulted and Agree with Plan of Care  Patient       Patient will benefit from skilled therapeutic intervention in order to improve the following deficits and impairments:  Abnormal gait, Decreased activity tolerance, Decreased balance, Decreased strength, Decreased range of motion, Difficulty walking, Pain, Increased edema  Visit Diagnosis: Pain in right ankle and joints of right foot  Stiffness of right ankle, not elsewhere classified  Difficulty in walking, not elsewhere classified     Problem List Patient Active Problem List   Diagnosis Date Noted  . Closed fracture of distal end of right tibia 12/02/2019  . Closed right pilon fracture, initial encounter 11/24/2019  . Fracture of right ankle 11/24/2019  . DIVERTICULOSIS, SIGMOID COLON 03/01/2011  . CHEST PAIN 03/01/2011    Standley Brooking, PTA 03/17/2020, 9:40 AM  [facility] [address] [place], [place], [address] Phone: [phone]   Fax:  [phone]  Name: JAHZEL SCANLON MRN: [id number] Date of Birth: 07/28/1941

## 2020-03-22 ENCOUNTER — Ambulatory Visit: Payer: Medicare PPO | Admitting: Physical Therapy

## 2020-03-22 ENCOUNTER — Other Ambulatory Visit: Payer: Self-pay

## 2020-03-22 ENCOUNTER — Encounter: Payer: Self-pay | Admitting: Physical Therapy

## 2020-03-22 DIAGNOSIS — M25571 Pain in right ankle and joints of right foot: Secondary | ICD-10-CM | POA: Diagnosis not present

## 2020-03-22 DIAGNOSIS — R262 Difficulty in walking, not elsewhere classified: Secondary | ICD-10-CM

## 2020-03-22 DIAGNOSIS — M25671 Stiffness of right ankle, not elsewhere classified: Secondary | ICD-10-CM

## 2020-03-22 NOTE — Therapy (Signed)
[facility] [place], [place], [address] Phone: [phone]   Fax:  [phone]  Physical Therapy Treatment   Progress Note Reporting Period 02/17/2020 to 03/22/2020  See note below for Objective Data and Assessment of Progress/Goals. Patient is making gains toward goals. Patient instructed importance of incorporating balance and proprioception activities to improve stability.      Patient Details  Name: Megan Boyle MRN: [id number] Date of Birth: [birth year] Referring Provider (PT): Katha Hamming, MD   Encounter Date: 03/22/2020  PT End of Session - 03/22/20 0912    Visit Number  10    Number of Visits  24    Date for PT Re-Evaluation  05/11/20    Authorization Type  [employer];FOTO, Progress note every 10th visit, KX modifier at 15th visit    PT Start Time  0815    PT Stop Time  0906    PT Time Calculation (min)  51 min    Equipment Utilized During Treatment  Other (comment)   walking stick   Activity Tolerance  Patient tolerated treatment well    Behavior During Therapy  [facility] for tasks assessed/performed       Past Medical History:  Diagnosis Date  . Chest pain   . Diverticulosis of colon (without mention of hemorrhage)     Past Surgical History:  Procedure Laterality Date  . ABDOMINAL HYSTERECTOMY    . APPENDECTOMY    . CHOLECYSTECTOMY    . OPEN REDUCTION INTERNAL FIXATION (ORIF) DISTAL RADIAL FRACTURE Left 03/31/2015   Procedure: OPEN REDUCTION INTERNAL FIXATION (ORIF) LEFT DISTAL RADIAL FRACTURE;  Surgeon: Daryll Brod, MD;  Location: [place];  Service: Orthopedics;  Laterality: Left;  . ORIF ANKLE FRACTURE Right 11/25/2019   Procedure: OPEN REDUCTION INTERNAL FIXATION (ORIF) RIGHT ANKLE FRACTURE;  Surgeon: Shona Needles, MD;  Location: [place];  Service: Orthopedics;  Laterality: Right;  Marland Kitchen VESICOVAGINAL FISTULA CLOSURE W/ TAH      There were no vitals filed for this visit.  Subjective  Assessment - 03/22/20 0817    Subjective  COVID 19 screening performed on patient upon arrival. Reports that she walked in the yard to her shed yesterday. Patient arrives ambulating with a large wooden walking stick.    Pertinent History  right pilon fracture ORIF 11/24/2020; osteoporosis    Limitations  Standing;Walking;House hold activities    How long can you stand comfortably?  short periods    How long can you walk comfortably?  short periods with walker and boot.    Diagnostic tests  x-ray: routine healing    Patient Stated Goals  walk again, wear high heels    Currently in Pain?  Yes   "more than last time" but did not provide number on pain scale        [facility] PT Assessment - 03/22/20 0001      Assessment   Medical Diagnosis  Fx right tibia, fx lower end of right tibia    Referring Provider (PT)  Katha Hamming, MD    Onset Date/Surgical Date  11/25/19    Next MD Visit  03/29/2020    Prior Therapy  home health PT      Precautions   Precautions  Other (comment)    Precaution Comments  able to wean out of CAM boot                   [facility] Adult PT Treatment/Exercise - 03/22/20 0001  Modalities   Modalities  Psychologist, educational Location  R ankle    Electrical Stimulation Action  pre-mod    Electrical Stimulation Parameters  80-150 hz x15 mins    Electrical Stimulation Goals  Pain;Edema      Vasopneumatic   Number Minutes Vasopneumatic   15 minutes    Vasopnuematic Location   Ankle    Vasopneumatic Pressure  Low    Vasopneumatic Temperature   34 for edema      Manual Therapy   Manual Therapy  Soft tissue mobilization    Soft tissue mobilization  STW and edema massage to RLE, peroneals. gastroc, posterior tib to reduce TPR and reduce edema, ray mobiization and PROM to right ankle      Ankle Exercises: Aerobic   Nustep  L4 x15 min                  PT Long Term Goals -  03/22/20 0911      PT LONG TERM GOAL #1   Title  Patient will be independent with HEP    Time  12    Period  Weeks    Status  Achieved      PT LONG TERM GOAL #2   Title  Patient will demonstrate 8+ degrees of right ankle DF AROM to improve gait mechanics.    Time  12    Period  Weeks    Status  On-going      PT LONG TERM GOAL #3   Title  Patient will demonstrate 4+/5 right ankle MMT in all planes to improve stability during functional tasks.    Time  12    Period  Weeks    Status  On-going      PT LONG TERM GOAL #4   Title  Patient will report ability to ambulate community distances with no AD and minimal gait deviations.    Time  12    Period  Weeks    Status  On-going      PT LONG TERM GOAL #5   Title  Patient will negotiate steps with reciprocating gait patten with one railing to safely access her community.    Time  12    Period  Weeks    Status  On-going            Plan - 03/22/20 0900    Clinical Impression Statement  Patient responded well to therapy session despite increase of right ankle pain secondary to walking in the yard up/down hills. No significant trigger points noted upon STW/M. Patient and PT discussed incorporating balance and proprioception activities to improve function and still allowing for time for edema massage/STW and PROM. Patient reported understanding. Goals ongoing at this time.    Personal Factors and Comorbidities  Age;Comorbidity 2    Comorbidities  Right tibia ORIF (right pilon fracture) 11/24/2020, Osteoporosis    Examination-Activity Limitations  Bathing;Locomotion Level;Stand;Stairs    Examination-Participation Restrictions  Driving;Meal Prep;Cleaning    Stability/Clinical Decision Making  Stable/Uncomplicated    Clinical Decision Making  Low    Rehab Potential  Good    PT Frequency  2x / week    PT Duration  12 weeks    PT Treatment/Interventions  ADLs/Self Care Home Management;Cryotherapy;Electrical Stimulation;Iontophoresis  4mg /ml Dexamethasone;Neuromuscular re-education;Balance training;Therapeutic exercise;Therapeutic activities;Functional mobility training;Stair training;Gait training;Patient/family education;Manual techniques;Passive range of motion;Vasopneumatic Device;Taping    PT Next Visit Plan  FOTO next visit 11th visit;  Add some balance activities and continue with edema massage and STW of R calf.    PT Home Exercise Plan  continue HEP provided by Home health PT    Consulted and Agree with Plan of Care  Patient       Patient will benefit from skilled therapeutic intervention in order to improve the following deficits and impairments:  Abnormal gait, Decreased activity tolerance, Decreased balance, Decreased strength, Decreased range of motion, Difficulty walking, Pain, Increased edema  Visit Diagnosis: Pain in right ankle and joints of right foot  Difficulty in walking, not elsewhere classified  Stiffness of right ankle, not elsewhere classified     Problem List Patient Active Problem List   Diagnosis Date Noted  . Closed fracture of distal end of right tibia 12/02/2019  . Closed right pilon fracture, initial encounter 11/24/2019  . Fracture of right ankle 11/24/2019  . DIVERTICULOSIS, SIGMOID COLON 03/01/2011  . CHEST PAIN 03/01/2011    Gabriela Eves, PT, DPT 03/22/2020, 9:13 AM  [facility] [address] [place], [place], [address] Phone: [phone]   Fax:  [phone]  Name: Megan Boyle MRN: [id number] Date of Birth: 23-Jan-1941

## 2020-03-24 ENCOUNTER — Ambulatory Visit: Payer: Medicare PPO | Admitting: Physical Therapy

## 2020-03-24 ENCOUNTER — Other Ambulatory Visit: Payer: Self-pay

## 2020-03-24 DIAGNOSIS — M25671 Stiffness of right ankle, not elsewhere classified: Secondary | ICD-10-CM

## 2020-03-24 DIAGNOSIS — R262 Difficulty in walking, not elsewhere classified: Secondary | ICD-10-CM

## 2020-03-24 DIAGNOSIS — M25571 Pain in right ankle and joints of right foot: Secondary | ICD-10-CM

## 2020-03-24 NOTE — Therapy (Signed)
[facility] [place], [place], [address] Phone: [phone]   Fax:  [phone]  Physical Therapy Treatment  Patient Details  Name: Megan Boyle MRN: [id number] Date of Birth: November 20, 1941 Referring Provider (PT): Katha Hamming, MD   Encounter Date: 03/24/2020  PT End of Session - 03/24/20 0912    Visit Number  11    Number of Visits  24    Date for PT Re-Evaluation  05/11/20    Authorization Type  [employer];FOTO, Progress note every 10th visit, KX modifier at 15th visit    PT Start Time  0815    PT Stop Time  0912    PT Time Calculation (min)  57 min    Equipment Utilized During Treatment  Other (comment)   walking stick   Activity Tolerance  Patient tolerated treatment well    Behavior During Therapy  [facility] for tasks assessed/performed       Past Medical History:  Diagnosis Date  . Chest pain   . Diverticulosis of colon (without mention of hemorrhage)     Past Surgical History:  Procedure Laterality Date  . ABDOMINAL HYSTERECTOMY    . APPENDECTOMY    . CHOLECYSTECTOMY    . OPEN REDUCTION INTERNAL FIXATION (ORIF) DISTAL RADIAL FRACTURE Left 03/31/2015   Procedure: OPEN REDUCTION INTERNAL FIXATION (ORIF) LEFT DISTAL RADIAL FRACTURE;  Surgeon: Daryll Brod, MD;  Location: [place];  Service: Orthopedics;  Laterality: Left;  . ORIF ANKLE FRACTURE Right 11/25/2019   Procedure: OPEN REDUCTION INTERNAL FIXATION (ORIF) RIGHT ANKLE FRACTURE;  Surgeon: Shona Needles, MD;  Location: [place];  Service: Orthopedics;  Laterality: Right;  Marland Kitchen VESICOVAGINAL FISTULA CLOSURE W/ TAH      There were no vitals filed for this visit.  Subjective Assessment - 03/24/20 0927    Subjective  COVID 19 screening performed on patient upon arrival. Reports increased right calf soreness last night.    Pertinent History  right pilon fracture ORIF 11/24/2020; osteoporosis    Limitations  Standing;Walking;House hold activities    How  long can you stand comfortably?  short periods    How long can you walk comfortably?  short periods with walker and boot.    Diagnostic tests  x-ray: routine healing    Patient Stated Goals  walk again, wear high heels    Currently in Pain?  Yes   did not provide number on pain scale        [facility] PT Assessment - 03/24/20 0001      Assessment   Medical Diagnosis  Fx right tibia, fx lower end of right tibia    Referring Provider (PT)  Katha Hamming, MD    Onset Date/Surgical Date  11/25/19    Next MD Visit  03/29/2020    Prior Therapy  home health PT      Precautions   Precautions  Other (comment)    Precaution Comments  able to wean out of CAM boot                   [facility] Adult PT Treatment/Exercise - 03/24/20 0001      Exercises   Exercises  Ankle      Modalities   Modalities  Electrical Stimulation;Vasopneumatic      Electrical Stimulation   Electrical Stimulation Location  R medial ankle and lower medial calf    Electrical Stimulation Action  pre-mod    Electrical Stimulation Parameters  80-150 hz x15 mins  Electrical Stimulation Goals  Pain;Edema      Vasopneumatic   Number Minutes Vasopneumatic   15 minutes    Vasopnuematic Location   Ankle    Vasopneumatic Pressure  Medium    Vasopneumatic Temperature   34 for edema      Manual Therapy   Manual Therapy  Soft tissue mobilization    Soft tissue mobilization  STW and edema massage to RLE, peroneals. gastroc, posterior tib to reduce TPR and reduce edema, ray mobiization and PROM to right ankle      Ankle Exercises: Aerobic   Nustep  L4 x15 min      Ankle Exercises: Standing   Rocker Board  2 minutes   balance AP and lateral each     Ankle Exercises: Seated   Other Seated Ankle Exercises  Dynadisc circles clockwise and counter clockwise x20 each                  PT Long Term Goals - 03/22/20 0911      PT LONG TERM GOAL #1   Title  Patient will be independent with HEP    Time  12     Period  Weeks    Status  Achieved      PT LONG TERM GOAL #2   Title  Patient will demonstrate 8+ degrees of right ankle DF AROM to improve gait mechanics.    Time  12    Period  Weeks    Status  On-going      PT LONG TERM GOAL #3   Title  Patient will demonstrate 4+/5 right ankle MMT in all planes to improve stability during functional tasks.    Time  12    Period  Weeks    Status  On-going      PT LONG TERM GOAL #4   Title  Patient will report ability to ambulate community distances with no AD and minimal gait deviations.    Time  12    Period  Weeks    Status  On-going      PT LONG TERM GOAL #5   Title  Patient will negotiate steps with reciprocating gait patten with one railing to safely access her community.    Time  12    Period  Weeks    Status  On-going            Plan - 03/24/20 0915    Clinical Impression Statement  Patient responded fairly well to the additional balance activities. Patient noted with moderate sway requiring intermittent UE support to maintain balance. Patient noted with tendernes to calf musculature that decreased with STW/M. Patient to see MD next tuesday.    Personal Factors and Comorbidities  Age;Comorbidity 2    Comorbidities  Right tibia ORIF (right pilon fracture) 11/24/2020, Osteoporosis    Examination-Activity Limitations  Bathing;Locomotion Level;Stand;Stairs    Examination-Participation Restrictions  Driving;Meal Prep;Cleaning    Stability/Clinical Decision Making  Stable/Uncomplicated    Clinical Decision Making  Low    Rehab Potential  Good    PT Frequency  2x / week    PT Duration  12 weeks    PT Treatment/Interventions  ADLs/Self Care Home Management;Cryotherapy;Electrical Stimulation;Iontophoresis 4mg /ml Dexamethasone;Neuromuscular re-education;Balance training;Therapeutic exercise;Therapeutic activities;Functional mobility training;Stair training;Gait training;Patient/family education;Manual techniques;Passive range of  motion;Vasopneumatic Device;Taping    PT Next Visit Plan  FOTO; MD note; Add some balance activities and continue with edema massage and STW of R calf.    PT Home Exercise Plan  continue HEP provided by Home health PT    Consulted and Agree with Plan of Care  Patient       Patient will benefit from skilled therapeutic intervention in order to improve the following deficits and impairments:  Abnormal gait, Decreased activity tolerance, Decreased balance, Decreased strength, Decreased range of motion, Difficulty walking, Pain, Increased edema  Visit Diagnosis: Pain in right ankle and joints of right foot  Difficulty in walking, not elsewhere classified  Stiffness of right ankle, not elsewhere classified     Problem List Patient Active Problem List   Diagnosis Date Noted  . Closed fracture of distal end of right tibia 12/02/2019  . Closed right pilon fracture, initial encounter 11/24/2019  . Fracture of right ankle 11/24/2019  . DIVERTICULOSIS, SIGMOID COLON 03/01/2011  . CHEST PAIN 03/01/2011    Gabriela Eves, PT, DPT 03/24/2020, 9:28 AM  [facility] [address] [place], [place], [address] Phone: [phone]   Fax:  [phone]  Name: OMANI ABDULAZIZ MRN: [id number] Date of Birth: 10-06-1941

## 2020-03-28 ENCOUNTER — Encounter: Payer: Self-pay | Admitting: Physical Therapy

## 2020-03-28 ENCOUNTER — Other Ambulatory Visit: Payer: Self-pay

## 2020-03-28 ENCOUNTER — Ambulatory Visit: Payer: Medicare PPO | Admitting: Physical Therapy

## 2020-03-28 DIAGNOSIS — M25671 Stiffness of right ankle, not elsewhere classified: Secondary | ICD-10-CM

## 2020-03-28 DIAGNOSIS — R262 Difficulty in walking, not elsewhere classified: Secondary | ICD-10-CM

## 2020-03-28 DIAGNOSIS — M25571 Pain in right ankle and joints of right foot: Secondary | ICD-10-CM

## 2020-03-28 NOTE — Therapy (Signed)
[facility] [place], [place], [address] Phone: [phone]   Fax:  [phone]  Physical Therapy Treatment  Patient Details  Name: Megan Boyle MRN: [id number] Date of Birth: 29-Jul-1941 Referring Provider (PT): Katha Hamming, MD   Encounter Date: 03/28/2020  PT End of Session - 03/28/20 0818    Visit Number  12    Number of Visits  24    Date for PT Re-Evaluation  05/11/20    Authorization Type  [employer];FOTO, Progress note every 10th visit, KX modifier at 15th visit    PT Start Time  0816    PT Stop Time  0902    PT Time Calculation (min)  46 min    Equipment Utilized During Treatment  Other (comment)   SPC   Activity Tolerance  Patient tolerated treatment well    Behavior During Therapy  [facility] for tasks assessed/performed       Past Medical History:  Diagnosis Date  . Chest pain   . Diverticulosis of colon (without mention of hemorrhage)     Past Surgical History:  Procedure Laterality Date  . ABDOMINAL HYSTERECTOMY    . APPENDECTOMY    . CHOLECYSTECTOMY    . OPEN REDUCTION INTERNAL FIXATION (ORIF) DISTAL RADIAL FRACTURE Left 03/31/2015   Procedure: OPEN REDUCTION INTERNAL FIXATION (ORIF) LEFT DISTAL RADIAL FRACTURE;  Surgeon: Daryll Brod, MD;  Location: [place];  Service: Orthopedics;  Laterality: Left;  . ORIF ANKLE FRACTURE Right 11/25/2019   Procedure: OPEN REDUCTION INTERNAL FIXATION (ORIF) RIGHT ANKLE FRACTURE;  Surgeon: Shona Needles, MD;  Location: [place];  Service: Orthopedics;  Laterality: Right;  Marland Kitchen VESICOVAGINAL FISTULA CLOSURE W/ TAH      There were no vitals filed for this visit.  Subjective Assessment - 03/28/20 0817    Subjective  COVID 19 screening performed on patient upon arrival. Reports that she walked around in her yard over the weekend and her calf hurts some    Pertinent History  right pilon fracture ORIF 11/24/2020; osteoporosis    Limitations  Standing;Walking;House  hold activities    How long can you stand comfortably?  short periods    How long can you walk comfortably?  short periods with walker and boot.    Diagnostic tests  x-ray: routine healing    Patient Stated Goals  walk again, wear high heels    Currently in Pain?  Yes    Pain Score  4    After taking Tylenol   Pain Location  Calf    Pain Orientation  Right    Pain Descriptors / Indicators  Sore;Aching;Throbbing    Pain Type  Surgical pain    Pain Onset  More than a month ago    Pain Frequency  Constant         [facility] PT Assessment - 03/28/20 0001      Assessment   Medical Diagnosis  Fx right tibia, fx lower end of right tibia    Referring Provider (PT)  Katha Hamming, MD    Onset Date/Surgical Date  11/25/19    Next MD Visit  03/29/2020    Prior Therapy  home health PT      Precautions   Precautions  Other (comment)    Precaution Comments  able to wean out of CAM boot      ROM / Strength   AROM / PROM / Strength  AROM;Strength      AROM   Overall AROM  Within functional limits for tasks performed    AROM Assessment Site  Ankle    Right/Left Ankle  Right    Right Ankle Dorsiflexion  10      Strength   Overall Strength  Within functional limits for tasks performed    Strength Assessment Site  Ankle    Right/Left Ankle  Right    Right Ankle Dorsiflexion  4+/5    Right Ankle Plantar Flexion  5/5    Right Ankle Inversion  4+/5    Right Ankle Eversion  4+/5                   [facility] Adult PT Treatment/Exercise - 03/28/20 0001      Ambulation/Gait   Stairs  Yes    Stairs Assistance  6: Modified independent (Device/Increase time)   Greater difficulty descending due to DF   Stair Management Technique  One rail Right;Alternating pattern;Forwards    Number of Stairs  4   x1 RT   Height of Stairs  6.5      Knee/Hip Exercises: Standing   Rocker Board  4 minutes      Modalities   Modalities  Consulting civil engineer Location  R ankle    Electrical Stimulation Action  Pre-Mod    Electrical Stimulation Parameters  80-150 hz x16mn    Electrical Stimulation Goals  Edema      Vasopneumatic   Number Minutes Vasopneumatic   10 minutes    Vasopnuematic Location   Ankle    Vasopneumatic Pressure  Low    Vasopneumatic Temperature   34 for edema      Manual Therapy   Manual Therapy  Soft tissue mobilization    Soft tissue mobilization  STW and edema massage to RLE, peroneals. gastroc, posterior tib to reduce TPR and reduce edema, ray mobiization and PROM to right ankle      Ankle Exercises: Aerobic   Recumbent Bike  L3 x10 min      Ankle Exercises: Standing   SLS  RLE SLS on beam with LLE on 6" step x2 min    Rocker Board  4 minutes   for stretch   Heel Raises  Both;20 reps    Toe Raise  20 reps      Ankle Exercises: Seated   BAPS  Sitting;Level 2;15 reps   DF/PF                 PT Long Term Goals - 03/28/20 0859      PT LONG TERM GOAL #1   Title  Patient will be independent with HEP    Time  12    Period  Weeks    Status  Achieved      PT LONG TERM GOAL #2   Title  Patient will demonstrate 8+ degrees of right ankle DF AROM to improve gait mechanics.    Time  12    Period  Weeks    Status  Achieved      PT LONG TERM GOAL #3   Title  Patient will demonstrate 4+/5 right ankle MMT in all planes to improve stability during functional tasks.    Time  12    Period  Weeks    Status  Achieved      PT LONG TERM GOAL #4   Title  Patient will report ability to ambulate community distances with no AD and minimal gait deviations.  Time  12    Period  Weeks    Status  On-going   using AD outside of home; no community distances yet 03/28/2020     PT LONG TERM GOAL #5   Title  Patient will negotiate steps with reciprocating gait patten with one railing to safely access her community.    Time  12    Period  Weeks    Status  Partially Met   Greater  difficulty descending stairs due to DF limits 03/28/2020           Plan - 03/28/20 0910    Clinical Impression Statement  Patient presented in clinic with some reports of tightness and discomfort after walking in her yard over the weekend with AD. Patient only not using AD within her home. Patient progressed to more balance activities and control activities without reports of discomfort. Tightness reported across dorsal ankle/foot and patient encouraged to continue massage and ROM of her foot at home. Patient able to ascend stairs well reciprically but descending more difficult due to L ankle DF limits. Normal modalities response noted following removal of the modalities.    Personal Factors and Comorbidities  Age;Comorbidity 2    Comorbidities  Right tibia ORIF (right pilon fracture) 11/24/2020, Osteoporosis    Examination-Activity Limitations  Bathing;Locomotion Level;Stand;Stairs    Examination-Participation Restrictions  Driving;Meal Prep;Cleaning    Stability/Clinical Decision Making  Stable/Uncomplicated    Rehab Potential  Good    PT Frequency  2x / week    PT Duration  12 weeks    PT Treatment/Interventions  ADLs/Self Care Home Management;Cryotherapy;Electrical Stimulation;Iontophoresis 37m/ml Dexamethasone;Neuromuscular re-education;Balance training;Therapeutic exercise;Therapeutic activities;Functional mobility training;Stair training;Gait training;Patient/family education;Manual techniques;Passive range of motion;Vasopneumatic Device;Taping    PT Next Visit Plan  Continue at MD discretion.    PT Home Exercise Plan  continue HEP provided by Home health PT    Consulted and Agree with Plan of Care  Patient       Patient will benefit from skilled therapeutic intervention in order to improve the following deficits and impairments:  Abnormal gait, Decreased activity tolerance, Decreased balance, Decreased strength, Decreased range of motion, Difficulty walking, Pain, Increased  edema  Visit Diagnosis: Pain in right ankle and joints of right foot  Difficulty in walking, not elsewhere classified  Stiffness of right ankle, not elsewhere classified     Problem List Patient Active Problem List   Diagnosis Date Noted  . Closed fracture of distal end of right tibia 12/02/2019  . Closed right pilon fracture, initial encounter 11/24/2019  . Fracture of right ankle 11/24/2019  . DIVERTICULOSIS, SIGMOID COLON 03/01/2011  . CHEST PAIN 03/01/2011    KStandley Brooking PTA 03/28/20 9:47 AM   [facility] [address] [address]Phone: [phone]  Fax:  [phone] Name: Megan HEINLEMRN: [id number] of Birth: 104/29/1942

## 2020-04-04 ENCOUNTER — Ambulatory Visit: Payer: Medicare PPO | Attending: Student | Admitting: Physical Therapy

## 2020-04-04 ENCOUNTER — Encounter: Payer: Self-pay | Admitting: Physical Therapy

## 2020-04-04 ENCOUNTER — Other Ambulatory Visit: Payer: Self-pay

## 2020-04-04 DIAGNOSIS — R262 Difficulty in walking, not elsewhere classified: Secondary | ICD-10-CM | POA: Diagnosis present

## 2020-04-04 DIAGNOSIS — M25671 Stiffness of right ankle, not elsewhere classified: Secondary | ICD-10-CM | POA: Diagnosis present

## 2020-04-04 DIAGNOSIS — M25571 Pain in right ankle and joints of right foot: Secondary | ICD-10-CM | POA: Insufficient documentation

## 2020-04-04 NOTE — Therapy (Signed)
[facility] [place], [place], [address] Phone: [phone]   Fax:  [phone]  Physical Therapy Treatment  Patient Details  Name: Megan Boyle MRN: [id number] Date of Birth: [birth year] Referring Provider (PT): Katha Hamming, MD   Encounter Date: 04/04/2020  PT End of Session - 04/04/20 0955    Visit Number  13    Number of Visits  24    Date for PT Re-Evaluation  05/11/20    Authorization Type  [employer];FOTO, Progress note every 10th visit, KX modifier at 15th visit    PT Start Time  0950    PT Stop Time  1038    PT Time Calculation (min)  48 min    Activity Tolerance  Patient tolerated treatment well    Behavior During Therapy  [facility] for tasks assessed/performed       Past Medical History:  Diagnosis Date  . Chest pain   . Diverticulosis of colon (without mention of hemorrhage)     Past Surgical History:  Procedure Laterality Date  . ABDOMINAL HYSTERECTOMY    . APPENDECTOMY    . CHOLECYSTECTOMY    . OPEN REDUCTION INTERNAL FIXATION (ORIF) DISTAL RADIAL FRACTURE Left 03/31/2015   Procedure: OPEN REDUCTION INTERNAL FIXATION (ORIF) LEFT DISTAL RADIAL FRACTURE;  Surgeon: Daryll Brod, MD;  Location: [place];  Service: Orthopedics;  Laterality: Left;  . ORIF ANKLE FRACTURE Right 11/25/2019   Procedure: OPEN REDUCTION INTERNAL FIXATION (ORIF) RIGHT ANKLE FRACTURE;  Surgeon: Shona Needles, MD;  Location: [place];  Service: Orthopedics;  Laterality: Right;  Marland Kitchen VESICOVAGINAL FISTULA CLOSURE W/ TAH      There were no vitals filed for this visit.  Subjective Assessment - 04/04/20 0954    Subjective  COVID 19 screening performed on patient upon arrival. Reports that MD pleased overall but said bursitis is possible.    Pertinent History  right pilon fracture ORIF 11/24/2020; osteoporosis    Limitations  Standing;Walking;House hold activities    How long can you stand comfortably?  short periods    How long can  you walk comfortably?  short periods with walker and boot.    Diagnostic tests  x-ray: routine healing    Patient Stated Goals  walk again, wear high heels    Currently in Pain?  No/denies         [facility] PT Assessment - 04/04/20 0001      Assessment   Medical Diagnosis  Fx right tibia, fx lower end of right tibia    Referring Provider (PT)  Katha Hamming, MD    Onset Date/Surgical Date  11/25/19    Next MD Visit  03/29/2020    Prior Therapy  home health PT      Precautions   Precautions  Other (comment)    Precaution Comments  able to wean out of CAM boot                   [facility] Adult PT Treatment/Exercise - 04/04/20 0001      Modalities   Modalities  Electrical Stimulation;Vasopneumatic      Electrical Stimulation   Electrical Stimulation Location  R ankle    Electrical Stimulation Action  Pre-Mod    Electrical Stimulation Parameters  80-150 hz x10 min    Electrical Stimulation Goals  Edema      Vasopneumatic   Number Minutes Vasopneumatic   10 minutes    Vasopnuematic Location   Ankle    Vasopneumatic Pressure  Low    Vasopneumatic Temperature   34 for edema      Manual Therapy   Manual Therapy  Soft tissue mobilization    Soft tissue mobilization  STW and edema massage to RLE, peroneals. gastroc, posterior tib to reduce TPR and reduce edema      Ankle Exercises: Standing   SLS  BLE SLS for hip flexion, no to intermittant UE support     Rocker Board  3 minutes    Heel Raises  Both;20 reps   2D   Toe Raise  20 reps      Ankle Exercises: Aerobic   Recumbent Bike  L3 x15 min                  PT Long Term Goals - 03/28/20 0859      PT LONG TERM GOAL #1   Title  Patient will be independent with HEP    Time  12    Period  Weeks    Status  Achieved      PT LONG TERM GOAL #2   Title  Patient will demonstrate 8+ degrees of right ankle DF AROM to improve gait mechanics.    Time  12    Period  Weeks    Status  Achieved      PT LONG TERM  GOAL #3   Title  Patient will demonstrate 4+/5 right ankle MMT in all planes to improve stability during functional tasks.    Time  12    Period  Weeks    Status  Achieved      PT LONG TERM GOAL #4   Title  Patient will report ability to ambulate community distances with no AD and minimal gait deviations.    Time  12    Period  Weeks    Status  On-going   using AD outside of home; no community distances yet 03/28/2020     PT LONG TERM GOAL #5   Title  Patient will negotiate steps with reciprocating gait patten with one railing to safely access her community.    Time  12    Period  Weeks    Status  Partially Met   Greater difficulty descending stairs due to DF limits 03/28/2020           Plan - 04/04/20 1146    Clinical Impression Statement  Patient presented in clinic with good progress noted by MD at last visit. Patient still very discouraged as she cannot get out in her flower gardens as much as she would like but is understanding of healing. SLS activities completed on airex today with good R ankle proprioception noted. Patient did report tenderness just superior to the medial R malleoli during manual therapy. Less TP noted in R gastroc upon assessment as well. Normal modalities response noted following removal of the modalities.    Personal Factors and Comorbidities  Age;Comorbidity 2    Comorbidities  Right tibia ORIF (right pilon fracture) 11/24/2020, Osteoporosis    Examination-Activity Limitations  Bathing;Locomotion Level;Stand;Stairs    Examination-Participation Restrictions  Driving;Meal Prep;Cleaning    Stability/Clinical Decision Making  Stable/Uncomplicated    PT Frequency  2x / week    PT Duration  12 weeks    PT Treatment/Interventions  ADLs/Self Care Home Management;Cryotherapy;Electrical Stimulation;Iontophoresis 57m/ml Dexamethasone;Neuromuscular re-education;Balance training;Therapeutic exercise;Therapeutic activities;Functional mobility training;Stair  training;Gait training;Patient/family education;Manual techniques;Passive range of motion;Vasopneumatic Device;Taping    PT Next Visit Plan  Continue at MD discretion.    PT  Home Exercise Plan  continue HEP provided by Home health PT    Consulted and Agree with Plan of Care  Patient       Patient will benefit from skilled therapeutic intervention in order to improve the following deficits and impairments:  Abnormal gait, Decreased activity tolerance, Decreased balance, Decreased strength, Decreased range of motion, Difficulty walking, Pain, Increased edema  Visit Diagnosis: Pain in right ankle and joints of right foot  Difficulty in walking, not elsewhere classified  Stiffness of right ankle, not elsewhere classified     Problem List Patient Active Problem List   Diagnosis Date Noted  . Closed fracture of distal end of right tibia 12/02/2019  . Closed right pilon fracture, initial encounter 11/24/2019  . Fracture of right ankle 11/24/2019  . DIVERTICULOSIS, SIGMOID COLON 03/01/2011  . CHEST PAIN 03/01/2011    Standley Brooking, PTA 04/04/2020, 11:54 AM  [facility] [address] [place], [place], [address] Phone: [phone]   Fax:  [phone]  Name: ARIENNA BENEGAS MRN: [id number] Date of Birth: 22-May-1941

## 2020-04-11 ENCOUNTER — Other Ambulatory Visit: Payer: Self-pay

## 2020-04-11 ENCOUNTER — Ambulatory Visit: Payer: Medicare PPO | Admitting: Physical Therapy

## 2020-04-11 ENCOUNTER — Encounter: Payer: Self-pay | Admitting: Physical Therapy

## 2020-04-11 DIAGNOSIS — M25671 Stiffness of right ankle, not elsewhere classified: Secondary | ICD-10-CM

## 2020-04-11 DIAGNOSIS — R262 Difficulty in walking, not elsewhere classified: Secondary | ICD-10-CM

## 2020-04-11 DIAGNOSIS — M25571 Pain in right ankle and joints of right foot: Secondary | ICD-10-CM | POA: Diagnosis not present

## 2020-04-11 NOTE — Therapy (Signed)
[facility] [place], [place], [address] Phone: [phone]   Fax:  [phone]  Physical Therapy Treatment  Patient Details  Name: Megan Boyle MRN: [id number] Date of Birth: 1941-10-02 Referring Provider (PT): Katha Hamming, MD   Encounter Date: 04/11/2020  PT End of Session - 04/11/20 0840    Visit Number  14    Number of Visits  24    Date for PT Re-Evaluation  05/11/20    Authorization Type  [employer];FOTO, Progress note every 10th visit, KX modifier at 15th visit    PT Start Time  0815    PT Stop Time  0907    PT Time Calculation (min)  52 min    Activity Tolerance  Patient tolerated treatment well    Behavior During Therapy  [facility] for tasks assessed/performed       Past Medical History:  Diagnosis Date  . Chest pain   . Diverticulosis of colon (without mention of hemorrhage)     Past Surgical History:  Procedure Laterality Date  . ABDOMINAL HYSTERECTOMY    . APPENDECTOMY    . CHOLECYSTECTOMY    . OPEN REDUCTION INTERNAL FIXATION (ORIF) DISTAL RADIAL FRACTURE Left 03/31/2015   Procedure: OPEN REDUCTION INTERNAL FIXATION (ORIF) LEFT DISTAL RADIAL FRACTURE;  Surgeon: Daryll Brod, MD;  Location: [place];  Service: Orthopedics;  Laterality: Left;  . ORIF ANKLE FRACTURE Right 11/25/2019   Procedure: OPEN REDUCTION INTERNAL FIXATION (ORIF) RIGHT ANKLE FRACTURE;  Surgeon: Shona Needles, MD;  Location: [place];  Service: Orthopedics;  Laterality: Right;  Marland Kitchen VESICOVAGINAL FISTULA CLOSURE W/ TAH      There were no vitals filed for this visit.  Subjective Assessment - 04/11/20 0949    Subjective  COVID 19 screening performed on patient upon arrival. Patient arrived feeling good.    Pertinent History  right pilon fracture ORIF 11/24/2020; osteoporosis    Limitations  Standing;Walking;House hold activities    How long can you stand comfortably?  short periods    How long can you walk comfortably?  short  periods with walker and boot.    Diagnostic tests  x-ray: routine healing    Patient Stated Goals  walk again, wear high heels    Currently in Pain?  No/denies         [facility] PT Assessment - 04/11/20 0001      Assessment   Medical Diagnosis  Fx right tibia, fx lower end of right tibia    Referring Provider (PT)  Katha Hamming, MD    Onset Date/Surgical Date  11/25/19    Prior Therapy  home health PT                   [facility] Adult PT Treatment/Exercise - 04/11/20 0001      Modalities   Modalities  Electrical Stimulation;Vasopneumatic      Electrical Stimulation   Electrical Stimulation Location  R ankle    Electrical Stimulation Action  pre-mod    Electrical Stimulation Parameters  80-150 hz x10 mins    Electrical Stimulation Goals  Edema      Vasopneumatic   Number Minutes Vasopneumatic   10 minutes    Vasopnuematic Location   Ankle    Vasopneumatic Pressure  Low    Vasopneumatic Temperature   34 for edema      Manual Therapy   Manual Therapy  --    Soft tissue mobilization  --      Ankle  Exercises: Standing   Rocker Board  3 minutes    Heel Walk (Round Trip)  x2 mins at parallel bars    Toe Walk (Round Trip)  x2 mins at parallel bars    Balance Beam  lateral stepping and tandem walking x2 mins each      Ankle Exercises: Aerobic   Recumbent Bike  L3 x15 min                  PT Long Term Goals - 03/28/20 0859      PT LONG TERM GOAL #1   Title  Patient will be independent with HEP    Time  12    Period  Weeks    Status  Achieved      PT LONG TERM GOAL #2   Title  Patient will demonstrate 8+ degrees of right ankle DF AROM to improve gait mechanics.    Time  12    Period  Weeks    Status  Achieved      PT LONG TERM GOAL #3   Title  Patient will demonstrate 4+/5 right ankle MMT in all planes to improve stability during functional tasks.    Time  12    Period  Weeks    Status  Achieved      PT LONG TERM GOAL #4   Title  Patient will  report ability to ambulate community distances with no AD and minimal gait deviations.    Time  12    Period  Weeks    Status  On-going   using AD outside of home; no community distances yet 03/28/2020     PT LONG TERM GOAL #5   Title  Patient will negotiate steps with reciprocating gait patten with one railing to safely access her community.    Time  12    Period  Weeks    Status  Partially Met   Greater difficulty descending stairs due to DF limits 03/28/2020           Plan - 04/11/20 0909    Clinical Impression Statement  Patient responded well to progression of balance activities. Patient reported "feeling it more" with toe walking but was able to complete reps with no other complaints. Patient and PT discussed slow progression into recreational activities and encouraged the use of a timer for outdoor gardening per MD. No adverse affects upon removal of modalites.    Personal Factors and Comorbidities  Age;Comorbidity 2    Comorbidities  Right tibia ORIF (right pilon fracture) 11/24/2020, Osteoporosis    Examination-Activity Limitations  Bathing;Locomotion Level;Stand;Stairs    Examination-Participation Restrictions  Driving;Meal Prep;Cleaning    Stability/Clinical Decision Making  Stable/Uncomplicated    Clinical Decision Making  Low    Rehab Potential  Good    PT Frequency  2x / week    PT Duration  12 weeks    PT Treatment/Interventions  ADLs/Self Care Home Management;Cryotherapy;Electrical Stimulation;Iontophoresis 65m/ml Dexamethasone;Neuromuscular re-education;Balance training;Therapeutic exercise;Therapeutic activities;Functional mobility training;Stair training;Gait training;Patient/family education;Manual techniques;Passive range of motion;Vasopneumatic Device;Taping    PT Next Visit Plan  continue with POC of balance, strengthening and gait training, STW/M and edema massage modalities PRN for pain relief    Consulted and Agree with Plan of Care  Patient       Patient  will benefit from skilled therapeutic intervention in order to improve the following deficits and impairments:  Abnormal gait, Decreased activity tolerance, Decreased balance, Decreased strength, Decreased range of motion, Difficulty walking, Pain, Increased  edema  Visit Diagnosis: Pain in right ankle and joints of right foot  Difficulty in walking, not elsewhere classified  Stiffness of right ankle, not elsewhere classified     Problem List Patient Active Problem List   Diagnosis Date Noted  . Closed fracture of distal end of right tibia 12/02/2019  . Closed right pilon fracture, initial encounter 11/24/2019  . Fracture of right ankle 11/24/2019  . DIVERTICULOSIS, SIGMOID COLON 03/01/2011  . CHEST PAIN 03/01/2011    Gabriela Eves, PT, DPT 04/11/2020, 10:42 AM  [facility] [address] [place], [place], [address] Phone: [phone]   Fax:  [phone]  Name: Megan Boyle MRN: [id number] Date of Birth: 25-Aug-1941

## 2020-04-18 ENCOUNTER — Other Ambulatory Visit: Payer: Self-pay

## 2020-04-18 ENCOUNTER — Ambulatory Visit: Payer: Medicare PPO | Admitting: Physical Therapy

## 2020-04-18 ENCOUNTER — Encounter: Payer: Self-pay | Admitting: Physical Therapy

## 2020-04-18 DIAGNOSIS — M25571 Pain in right ankle and joints of right foot: Secondary | ICD-10-CM | POA: Diagnosis not present

## 2020-04-18 DIAGNOSIS — R262 Difficulty in walking, not elsewhere classified: Secondary | ICD-10-CM

## 2020-04-18 DIAGNOSIS — M25671 Stiffness of right ankle, not elsewhere classified: Secondary | ICD-10-CM

## 2020-04-18 NOTE — Therapy (Signed)
[facility] [place], [place], [address] Phone: [phone]   Fax:  [phone]  Physical Therapy Treatment  Patient Details  Name: Megan Boyle MRN: [id number] Date of Birth: 1941/03/30 Referring Provider (PT): Katha Hamming, MD   Encounter Date: 04/18/2020  PT End of Session - 04/18/20 0814    Visit Number  15    Number of Visits  24    Date for PT Re-Evaluation  05/11/20    Authorization Type  [employer];FOTO, Progress note every 10th visit, KX modifier at 15th visit    PT Start Time  0815    PT Stop Time  0859    PT Time Calculation (min)  44 min    Activity Tolerance  Patient tolerated treatment well    Behavior During Therapy  [facility] for tasks assessed/performed       Past Medical History:  Diagnosis Date  . Chest pain   . Diverticulosis of colon (without mention of hemorrhage)     Past Surgical History:  Procedure Laterality Date  . ABDOMINAL HYSTERECTOMY    . APPENDECTOMY    . CHOLECYSTECTOMY    . OPEN REDUCTION INTERNAL FIXATION (ORIF) DISTAL RADIAL FRACTURE Left 03/31/2015   Procedure: OPEN REDUCTION INTERNAL FIXATION (ORIF) LEFT DISTAL RADIAL FRACTURE;  Surgeon: Daryll Brod, MD;  Location: [place];  Service: Orthopedics;  Laterality: Left;  . ORIF ANKLE FRACTURE Right 11/25/2019   Procedure: OPEN REDUCTION INTERNAL FIXATION (ORIF) RIGHT ANKLE FRACTURE;  Surgeon: Shona Needles, MD;  Location: [place];  Service: Orthopedics;  Laterality: Right;  Marland Kitchen VESICOVAGINAL FISTULA CLOSURE W/ TAH      There were no vitals filed for this visit.  Subjective Assessment - 04/18/20 0814    Subjective  COVID 19 screening performed on patient upon arrival. Patient reports feeling better today than she has in several days due to dizziness. Reports more discomfort especially around R medial malleolus.    Pertinent History  right pilon fracture ORIF 11/24/2020; osteoporosis    Limitations  Standing;Walking;House hold  activities    How long can you stand comfortably?  short periods    How long can you walk comfortably?  short periods with walker and boot.    Diagnostic tests  x-ray: routine healing    Patient Stated Goals  walk again, wear high heels    Currently in Pain?  No/denies         [facility] PT Assessment - 04/18/20 0001      Assessment   Medical Diagnosis  Fx right tibia, fx lower end of right tibia    Referring Provider (PT)  Katha Hamming, MD    Onset Date/Surgical Date  11/25/19    Prior Therapy  home health PT                   [facility] Adult PT Treatment/Exercise - 04/18/20 0001      Modalities   Modalities  Electrical Stimulation;Vasopneumatic      Electrical Stimulation   Electrical Stimulation Location  R medial ankle    Electrical Stimulation Action  Pre- Mod    Electrical Stimulation Parameters  80-150 hz x10 min    Electrical Stimulation Goals  Edema      Vasopneumatic   Number Minutes Vasopneumatic   10 minutes    Vasopnuematic Location   Ankle    Vasopneumatic Pressure  Low    Vasopneumatic Temperature   48      Ankle Exercises: Aerobic  Recumbent Bike  L4 x10 min      Ankle Exercises: Standing   Rocker Board  4 minutes    Heel Raises  Both;20 reps    Toe Raise  20 reps          Balance Exercises - 04/18/20 0857      Balance Exercises: Standing   SLS  Eyes open;Foam/compliant surface;Solid surface;Intermittent upper extremity support;Time    SLS Time  x1 min on floor, x61mn on airex for reaching    Tandem Gait  Forward;Intermittent upper extremity support;Foam/compliant surface;5 reps    Sidestepping  Foam/compliant support;5 reps    Other Standing Exercises  Inverted BOSU x5 min with intermittant UE support             PT Long Term Goals - 03/28/20 0859      PT LONG TERM GOAL #1   Title  Patient will be independent with HEP    Time  12    Period  Weeks    Status  Achieved      PT LONG TERM GOAL #2   Title  Patient will  demonstrate 8+ degrees of right ankle DF AROM to improve gait mechanics.    Time  12    Period  Weeks    Status  Achieved      PT LONG TERM GOAL #3   Title  Patient will demonstrate 4+/5 right ankle MMT in all planes to improve stability during functional tasks.    Time  12    Period  Weeks    Status  Achieved      PT LONG TERM GOAL #4   Title  Patient will report ability to ambulate community distances with no AD and minimal gait deviations.    Time  12    Period  Weeks    Status  On-going   using AD outside of home; no community distances yet 03/28/2020     PT LONG TERM GOAL #5   Title  Patient will negotiate steps with reciprocating gait patten with one railing to safely access her community.    Time  12    Period  Weeks    Status  Partially Met   Greater difficulty descending stairs due to DF limits 03/28/2020           Plan - 04/18/20 0903    Clinical Impression Statement  Patient presented in clinic with reports of dizziness recently as well as minimal discomfort at medial malleolus. Patient progressed with balance activities on uneven surfaces with apropriate ankle strategies noted. Normal modalities response noted following removal of the modalities.    Personal Factors and Comorbidities  Age;Comorbidity 2    Comorbidities  Right tibia ORIF (right pilon fracture) 11/24/2020, Osteoporosis    Examination-Activity Limitations  Bathing;Locomotion Level;Stand;Stairs    Examination-Participation Restrictions  Driving;Meal Prep;Cleaning    Stability/Clinical Decision Making  Stable/Uncomplicated    Rehab Potential  Good    PT Frequency  2x / week    PT Duration  12 weeks    PT Treatment/Interventions  ADLs/Self Care Home Management;Cryotherapy;Electrical Stimulation;Iontophoresis 468mml Dexamethasone;Neuromuscular re-education;Balance training;Therapeutic exercise;Therapeutic activities;Functional mobility training;Stair training;Gait training;Patient/family education;Manual  techniques;Passive range of motion;Vasopneumatic Device;Taping    PT Next Visit Plan  continue with POC of balance, strengthening and gait training, STW/M and edema massage modalities PRN for pain relief    PT Home Exercise Plan  continue HEP provided by Home health PT    Consulted and Agree with Plan of Care  Patient       Patient will benefit from skilled therapeutic intervention in order to improve the following deficits and impairments:  Abnormal gait, Decreased activity tolerance, Decreased balance, Decreased strength, Decreased range of motion, Difficulty walking, Pain, Increased edema  Visit Diagnosis: Pain in right ankle and joints of right foot  Difficulty in walking, not elsewhere classified  Stiffness of right ankle, not elsewhere classified     Problem List Patient Active Problem List   Diagnosis Date Noted  . Closed fracture of distal end of right tibia 12/02/2019  . Closed right pilon fracture, initial encounter 11/24/2019  . Fracture of right ankle 11/24/2019  . DIVERTICULOSIS, SIGMOID COLON 03/01/2011  . CHEST PAIN 03/01/2011    Standley Brooking, PTA 04/18/2020, 9:11 AM  [facility] [address] [place], [place], [address] Phone: [phone]   Fax:  [phone]  Name: Megan Boyle MRN: [id number] Date of Birth: 1941-01-11

## 2020-04-25 ENCOUNTER — Other Ambulatory Visit: Payer: Self-pay

## 2020-04-25 ENCOUNTER — Encounter: Payer: Self-pay | Admitting: Physical Therapy

## 2020-04-25 ENCOUNTER — Ambulatory Visit: Payer: Medicare PPO | Admitting: Physical Therapy

## 2020-04-25 DIAGNOSIS — M25571 Pain in right ankle and joints of right foot: Secondary | ICD-10-CM | POA: Diagnosis not present

## 2020-04-25 DIAGNOSIS — M25671 Stiffness of right ankle, not elsewhere classified: Secondary | ICD-10-CM

## 2020-04-25 DIAGNOSIS — R262 Difficulty in walking, not elsewhere classified: Secondary | ICD-10-CM
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
## 2020-08-18 ENCOUNTER — Other Ambulatory Visit (HOSPITAL_COMMUNITY): Payer: Self-pay

## 2020-08-19 ENCOUNTER — Ambulatory Visit (HOSPITAL_COMMUNITY)
Admission: RE | Admit: 2020-08-19 | Discharge: 2020-08-19 | Disposition: A | Discharge status: Home / Self Care | Payer: Medicare PPO | Source: Ambulatory Visit | Attending: Endocrinology | Admitting: Endocrinology

## 2020-08-19 ENCOUNTER — Other Ambulatory Visit: Payer: Self-pay

## 2020-08-19 DIAGNOSIS — M810 Age-related osteoporosis without current pathological fracture: Secondary | ICD-10-CM | POA: Diagnosis not present

## 2020-08-19 MED ORDER — DENOSUMAB 60 MG/ML ~~LOC~~ SOSY
PREFILLED_SYRINGE | SUBCUTANEOUS | Status: AC
  Filled 2020-08-19: qty 1

## 2020-08-19 MED ORDER — DENOSUMAB 60 MG/ML ~~LOC~~ SOSY
60.0000 mg | PREFILLED_SYRINGE | Freq: Once | SUBCUTANEOUS | Status: AC
  Administered 2020-08-19: 60 mg via SUBCUTANEOUS

## 2020-11-21 ENCOUNTER — Other Ambulatory Visit: Payer: Self-pay | Admitting: Internal Medicine

## 2020-11-21 DIAGNOSIS — D62 Acute posthemorrhagic anemia: Secondary | ICD-10-CM
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
## 2021-03-15 ENCOUNTER — Ambulatory Visit (HOSPITAL_COMMUNITY)
Admission: RE | Admit: 2021-03-15 | Discharge: 2021-03-15 | Disposition: A | Discharge status: Home / Self Care | Payer: Medicare PPO | Source: Ambulatory Visit | Attending: Endocrinology | Admitting: Endocrinology

## 2021-03-15 DIAGNOSIS — M810 Age-related osteoporosis without current pathological fracture: Secondary | ICD-10-CM | POA: Insufficient documentation

## 2021-03-15 MED ORDER — DENOSUMAB 60 MG/ML ~~LOC~~ SOSY
PREFILLED_SYRINGE | SUBCUTANEOUS | Status: AC
  Administered 2021-03-15: 60 mg via SUBCUTANEOUS
  Filled 2021-03-15: qty 1

## 2021-03-15 MED ORDER — DENOSUMAB 60 MG/ML ~~LOC~~ SOSY: 60.0000 mg | PREFILLED_SYRINGE | Freq: Once | SUBCUTANEOUS | Status: AC

## 2021-05-22 ENCOUNTER — Other Ambulatory Visit: Payer: Self-pay | Admitting: Endocrinology

## 2021-05-22 DIAGNOSIS — Z1231 Encounter for screening mammogram for malignant neoplasm of breast: Secondary | ICD-10-CM

## 2021-06-01 ENCOUNTER — Ambulatory Visit
Admission: RE | Admit: 2021-06-01 | Discharge: 2021-06-01 | Disposition: A | Discharge status: Home / Self Care | Payer: Medicare PPO | Source: Ambulatory Visit | Attending: Endocrinology | Admitting: Endocrinology

## 2021-06-01 ENCOUNTER — Other Ambulatory Visit: Payer: Self-pay

## 2021-06-01 DIAGNOSIS — Z1231 Encounter for screening mammogram for malignant neoplasm of breast: Secondary | ICD-10-CM

## 2021-06-19 ENCOUNTER — Ambulatory Visit: Payer: Medicare PPO | Admitting: Physical Therapy

## 2021-07-13 ENCOUNTER — Ambulatory Visit: Payer: Medicare PPO | Attending: Endocrinology | Admitting: Physical Therapy

## 2021-07-13 ENCOUNTER — Other Ambulatory Visit: Payer: Self-pay

## 2021-07-13 DIAGNOSIS — M546 Pain in thoracic spine: Secondary | ICD-10-CM | POA: Diagnosis present

## 2021-07-13 DIAGNOSIS — M5414 Radiculopathy, thoracic region: Secondary | ICD-10-CM

## 2021-07-13 DIAGNOSIS — R293 Abnormal posture: Secondary | ICD-10-CM | POA: Diagnosis present
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
## 2021-07-17 ENCOUNTER — Other Ambulatory Visit: Payer: Self-pay

## 2021-07-17 ENCOUNTER — Ambulatory Visit: Payer: Medicare PPO | Admitting: Physical Therapy

## 2021-07-17 DIAGNOSIS — M5414 Radiculopathy, thoracic region: Secondary | ICD-10-CM | POA: Diagnosis not present

## 2021-07-17 DIAGNOSIS — R293 Abnormal posture: Secondary | ICD-10-CM

## 2021-07-17 DIAGNOSIS — M546 Pain in thoracic spine: Secondary | ICD-10-CM

## 2021-07-17 NOTE — Therapy (Signed)
[facility] [place], [place], [address] Phone: [phone]   Fax:  [phone]  Physical Therapy Treatment  Patient Details  Name: Megan Boyle MRN: [id number] Date of Birth: 1941-05-29 Referring Provider (PT): Reynold Bowen MD   Encounter Date: 07/17/2021   PT End of Session - 07/17/21 1207     Visit Number 2    Number of Visits 12    Date for PT Re-Evaluation 08/24/21    PT Start Time 1118    PT Stop Time 1202    PT Time Calculation (min) 44 min    Activity Tolerance Patient tolerated treatment well    Behavior During Therapy [facility] for tasks assessed/performed             Past Medical History:  Diagnosis Date   Chest pain    Diverticulosis of colon (without mention of hemorrhage)     Past Surgical History:  Procedure Laterality Date   ABDOMINAL HYSTERECTOMY     APPENDECTOMY     CHOLECYSTECTOMY     OPEN REDUCTION INTERNAL FIXATION (ORIF) DISTAL RADIAL FRACTURE Left 03/31/2015   Procedure: OPEN REDUCTION INTERNAL FIXATION (ORIF) LEFT DISTAL RADIAL FRACTURE;  Surgeon: Daryll Brod, MD;  Location: [place];  Service: Orthopedics;  Laterality: Left;   ORIF ANKLE FRACTURE Right 11/25/2019   Procedure: OPEN REDUCTION INTERNAL FIXATION (ORIF) RIGHT ANKLE FRACTURE;  Surgeon: Shona Needles, MD;  Location: [place];  Service: Orthopedics;  Laterality: Right;   VESICOVAGINAL FISTULA CLOSURE W/ TAH      There were no vitals filed for this visit.   Subjective Assessment - 07/17/21 1210     Subjective COVID-19 screen performed prior to patient entering clinic.  Increased pain after first tretament but better now.    Pertinent History right pilon fracture ORIF 11/24/2020; osteoporosis, left wrist ORIF.    Patient Stated Goals Get out of pain.    Currently in Pain? Yes    Pain Score 2     Pain Location Back    Pain Orientation Left    Pain Descriptors / Indicators Aching    Pain Onset More than a month  ago                               [facility] Adult PT Treatment/Exercise - 07/17/21 0001       Modalities   Modalities Electrical Stimulation;Moist Heat;Ultrasound      Moist Heat Therapy   Number Minutes Moist Heat 15 Minutes    Moist Heat Location --   Thoracic.     Surveyor, mining region.    Electrical Stimulation Action IFC at 80-150 Hz. at 40% scan x 15 minutes.      Ultrasound   Ultrasound Location left affected Thoracic region.    Ultrasound Parameters Combo e'stim/US at 1.50 W/CM2 x 12 minutes.      Manual Therapy   Manual Therapy Soft tissue mobilization    Soft tissue mobilization STW/M x 11 minutes to patient's affected left thoracic region.                         PT Long Term Goals - 07/13/21 1205       PT LONG TERM GOAL #1   Title Patient will be independent with HEP    Time 6    Period Weeks  Status New      PT LONG TERM GOAL #2   Title Bilateral active cervical rotation to 60 degrees.    Time 6    Period Weeks    Status New      PT LONG TERM GOAL #3   Title Perform ADL's with pain not > 2/10.    Time 6    Period Weeks    Status New                   Plan - 07/17/21 1215     Clinical Impression Statement The patient tolerated treatment very well with no pain complaints following treatment.    Personal Factors and Comorbidities Comorbidity 1    Comorbidities right pilon fracture ORIF 11/24/2020; osteoporosis, left wrist ORIF.    Examination-Participation Restrictions Other    Stability/Clinical Decision Making Stable/Uncomplicated    Rehab Potential Excellent    PT Frequency 2x / week    PT Treatment/Interventions ADLs/Self Care Home Management;Cryotherapy;Ultrasound;Therapeutic activities;Therapeutic exercise;Manual techniques;Patient/family education;Passive range of motion;Dry needling    PT Next Visit Plan Combo e'stim/US, STW/M, postural  exercises to include resisted scapular retraction.    Consulted and Agree with Plan of Care Patient             Patient will benefit from skilled therapeutic intervention in order to improve the following deficits and impairments:  Decreased activity tolerance, Pain, Increased muscle spasms, Postural dysfunction, Decreased range of motion  Visit Diagnosis: Abnormal posture  Pain in thoracic spine     Problem List Patient Active Problem List   Diagnosis Date Noted   Closed fracture of distal end of right tibia 12/02/2019   Closed right pilon fracture, initial encounter 11/24/2019   Fracture of right ankle 11/24/2019   DIVERTICULOSIS, SIGMOID COLON 03/01/2011   CHEST PAIN 03/01/2011    Liv Rallis, [place] MPT 07/17/2021, 12:17 PM  [facility] [address] [place], [place], [address] Phone: [phone]   Fax:  [phone]  Name: Megan Boyle MRN: [id number] Date of Birth: [birth year]

## 2021-07-19 ENCOUNTER — Ambulatory Visit: Payer: Medicare PPO

## 2021-07-20 ENCOUNTER — Other Ambulatory Visit: Payer: Self-pay

## 2021-07-20 ENCOUNTER — Ambulatory Visit: Payer: Medicare PPO | Admitting: Physical Therapy

## 2021-07-20 DIAGNOSIS — M5414 Radiculopathy, thoracic region: Secondary | ICD-10-CM | POA: Diagnosis not present

## 2021-07-20 DIAGNOSIS — M546 Pain in thoracic spine: Secondary | ICD-10-CM

## 2021-07-20 NOTE — Therapy (Signed)
[facility] [place], [place], [address] Phone: [phone]   Fax:  [phone]  Physical Therapy Treatment  Patient Details  Name: Megan Boyle MRN: [id number] Date of Birth: [birth year] Referring Provider (PT): Reynold Bowen MD   Encounter Date: 07/20/2021   PT End of Session - 07/20/21 1337     Visit Number 3    Number of Visits 12    Date for PT Re-Evaluation 08/24/21    PT Start Time 0100    PT Stop Time 0155    PT Time Calculation (min) 55 min    Activity Tolerance Patient tolerated treatment well    Behavior During Therapy [facility] for tasks assessed/performed             Past Medical History:  Diagnosis Date   Chest pain    Diverticulosis of colon (without mention of hemorrhage)     Past Surgical History:  Procedure Laterality Date   ABDOMINAL HYSTERECTOMY     APPENDECTOMY     CHOLECYSTECTOMY     OPEN REDUCTION INTERNAL FIXATION (ORIF) DISTAL RADIAL FRACTURE Left 03/31/2015   Procedure: OPEN REDUCTION INTERNAL FIXATION (ORIF) LEFT DISTAL RADIAL FRACTURE;  Surgeon: Daryll Brod, MD;  Location: [place];  Service: Orthopedics;  Laterality: Left;   ORIF ANKLE FRACTURE Right 11/25/2019   Procedure: OPEN REDUCTION INTERNAL FIXATION (ORIF) RIGHT ANKLE FRACTURE;  Surgeon: Shona Needles, MD;  Location: [place];  Service: Orthopedics;  Laterality: Right;   VESICOVAGINAL FISTULA CLOSURE W/ TAH      There were no vitals filed for this visit.   Subjective Assessment - 07/20/21 1338     Subjective COVID-19 screen performed prior to patient entering clinic.  Doing well.    Pertinent History right pilon fracture ORIF 11/24/2020; osteoporosis, left wrist ORIF.    Patient Stated Goals Get out of pain.    Currently in Pain? Yes    Pain Score 2     Pain Location Back    Pain Orientation Left    Pain Descriptors / Indicators Aching    Pain Onset More than a month ago                                [facility] Adult PT Treatment/Exercise - 07/20/21 0001       Modalities   Modalities Electrical Stimulation;Moist Heat      Moist Heat Therapy   Number Minutes Moist Heat 20 Minutes    Moist Heat Location --   Thoracic region.     Acupuncturist Location Affected thoracic region.    Electrical Stimulation Action IFC at 80-150 Hz x 20 minutes on 405 scan.    Electrical Stimulation Goals Tone;Pain      Ultrasound   Ultrasound Location In prone over pillow for comfort:  Combo e'stim/US at 1.50 W/CM2 x 12 minutes.      Manual Therapy   Manual Therapy Soft tissue mobilization    Soft tissue mobilization STW/M x 12 minutes to patient's affected left thoracic region wiht patient prone over pillow for comfort.                         PT Long Term Goals - 07/13/21 1205       PT LONG TERM GOAL #1   Title Patient will be independent with HEP    Time 6  Period Weeks    Status New      PT LONG TERM GOAL #2   Title Bilateral active cervical rotation to 60 degrees.    Time 6    Period Weeks    Status New      PT LONG TERM GOAL #3   Title Perform ADL's with pain not > 2/10.    Time 6    Period Weeks    Status New                   Plan - 07/20/21 1341     Clinical Impression Statement Patient doing better with a reduction in pain this week.  Good respone to treatment today.    Personal Factors and Comorbidities Comorbidity 1    Comorbidities right pilon fracture ORIF 11/24/2020; osteoporosis, left wrist ORIF.    Examination-Activity Limitations Other    Examination-Participation Restrictions Other    Stability/Clinical Decision Making Stable/Uncomplicated    Rehab Potential Excellent    PT Frequency 2x / week    PT Treatment/Interventions ADLs/Self Care Home Management;Cryotherapy;Ultrasound;Therapeutic activities;Therapeutic exercise;Manual techniques;Patient/family  education;Passive range of motion;Dry needling    PT Next Visit Plan Combo e'stim/US, STW/M, postural exercises to include resisted scapular retraction.    Consulted and Agree with Plan of Care Patient             Patient will benefit from skilled therapeutic intervention in order to improve the following deficits and impairments:     Visit Diagnosis: Pain in thoracic spine     Problem List Patient Active Problem List   Diagnosis Date Noted   Closed fracture of distal end of right tibia 12/02/2019   Closed right pilon fracture, initial encounter 11/24/2019   Fracture of right ankle 11/24/2019   DIVERTICULOSIS, SIGMOID COLON 03/01/2011   CHEST PAIN 03/01/2011    Dhruva Orndoff, [place] MPT 07/20/2021, 2:14 PM  [facility] [address] [place], [place], [address] Phone: [phone]   Fax:  [phone]  Name: Megan Boyle MRN: [id number] Date of Birth: [birth year]

## 2021-07-25 ENCOUNTER — Encounter: Payer: Self-pay | Admitting: Gastroenterology

## 2021-07-25 ENCOUNTER — Other Ambulatory Visit: Payer: Self-pay

## 2021-07-25 ENCOUNTER — Ambulatory Visit: Payer: Medicare PPO | Admitting: Physical Therapy

## 2021-07-25 DIAGNOSIS — M5414 Radiculopathy, thoracic region: Secondary | ICD-10-CM | POA: Diagnosis not present

## 2021-07-25 DIAGNOSIS — R293 Abnormal posture: Secondary | ICD-10-CM

## 2021-07-25 DIAGNOSIS — M546 Pain in thoracic spine: Secondary | ICD-10-CM

## 2021-07-25 NOTE — Therapy (Signed)
[facility] [place], [place], [address] Phone: [phone]   Fax:  [phone]  Physical Therapy Treatment  Patient Details  Name: Megan Boyle MRN: [id number] Date of Birth: Nov 25, 1941 Referring Provider (PT): Reynold Bowen MD   Encounter Date: 07/25/2021   PT End of Session - 07/25/21 1143     Visit Number 4    Number of Visits 12    Date for PT Re-Evaluation 08/24/21    PT Start Time 1031    PT Stop Time 1125    PT Time Calculation (min) 54 min    Activity Tolerance Patient tolerated treatment well    Behavior During Therapy [facility] for tasks assessed/performed             Past Medical History:  Diagnosis Date   Chest pain    Diverticulosis of colon (without mention of hemorrhage)     Past Surgical History:  Procedure Laterality Date   ABDOMINAL HYSTERECTOMY     APPENDECTOMY     CHOLECYSTECTOMY     OPEN REDUCTION INTERNAL FIXATION (ORIF) DISTAL RADIAL FRACTURE Left 03/31/2015   Procedure: OPEN REDUCTION INTERNAL FIXATION (ORIF) LEFT DISTAL RADIAL FRACTURE;  Surgeon: Daryll Brod, MD;  Location: [place];  Service: Orthopedics;  Laterality: Left;   ORIF ANKLE FRACTURE Right 11/25/2019   Procedure: OPEN REDUCTION INTERNAL FIXATION (ORIF) RIGHT ANKLE FRACTURE;  Surgeon: Shona Needles, MD;  Location: [place];  Service: Orthopedics;  Laterality: Right;   VESICOVAGINAL FISTULA CLOSURE W/ TAH      There were no vitals filed for this visit.   Subjective Assessment - 07/25/21 1144     Subjective COVID-19 screen performed prior to patient entering clinic.  Doing well.    Pertinent History right pilon fracture ORIF 11/24/2020; osteoporosis, left wrist ORIF.    Patient Stated Goals Get out of pain.    Currently in Pain? Yes    Pain Score 1     Pain Location Back    Pain Orientation Right;Left    Pain Descriptors / Indicators Aching    Pain Type Acute pain    Pain Onset More than a month ago                                [facility] Adult PT Treatment/Exercise - 07/25/21 0001       Electrical Stimulation   Electrical Stimulation Location Bilateral UT's/Affected Thoracic region.    Electrical Stimulation Action IFC at 80-150 Hz.    Electrical Stimulation Parameters 40% scan x 20 minutes.    Electrical Stimulation Goals Tone;Pain      Ultrasound   Ultrasound Location Bilateral thoracic.    Ultrasound Parameters Combo e'stim/US at 1.50 W/CM2 x 12 minutes.    Ultrasound Goals Pain      Manual Therapy   Manual Therapy Soft tissue mobilization    Soft tissue mobilization STW/M to bilateral UT's and scapular region today x 12 minutes.                         PT Long Term Goals - 07/13/21 1205       PT LONG TERM GOAL #1   Title Patient will be independent with HEP    Time 6    Period Weeks    Status New      PT LONG TERM GOAL #2   Title Bilateral active cervical  rotation to 60 degrees.    Time 6    Period Weeks    Status New      PT LONG TERM GOAL #3   Title Perform ADL's with pain not > 2/10.    Time 6    Period Weeks    Status New                   Plan - 07/25/21 1147     Clinical Impression Statement Great response to treatments.  Soem pain reported over bilateral UT's with trigger points present.  She did well with treatment wiht no pain reported after treatment.    Personal Factors and Comorbidities Comorbidity 1    Comorbidities right pilon fracture ORIF 11/24/2020; osteoporosis, left wrist ORIF.    Examination-Activity Limitations Other    Examination-Participation Restrictions Other    Stability/Clinical Decision Making Stable/Uncomplicated    Rehab Potential Excellent    PT Frequency 2x / week    PT Duration 6 weeks    PT Treatment/Interventions ADLs/Self Care Home Management;Cryotherapy;Ultrasound;Therapeutic activities;Therapeutic exercise;Manual techniques;Patient/family education;Passive range of motion;Dry  needling    PT Next Visit Plan Combo e'stim/US, STW/M, postural exercises to include resisted scapular retraction.    Consulted and Agree with Plan of Care Patient             Patient will benefit from skilled therapeutic intervention in order to improve the following deficits and impairments:  Decreased activity tolerance, Pain, Increased muscle spasms, Postural dysfunction, Decreased range of motion  Visit Diagnosis: Pain in thoracic spine  Abnormal posture     Problem List Patient Active Problem List   Diagnosis Date Noted   Closed fracture of distal end of right tibia 12/02/2019   Closed right pilon fracture, initial encounter 11/24/2019   Fracture of right ankle 11/24/2019   DIVERTICULOSIS, SIGMOID COLON 03/01/2011   CHEST PAIN 03/01/2011    [name], [place] MPT 07/25/2021, 11:52 AM  [facility] [address] [place], [place], [address] Phone: [phone]   Fax:  [phone]  Name: Megan Boyle MRN: [id number] Date of Birth: 09/08/1941

## 2021-07-27 ENCOUNTER — Ambulatory Visit: Payer: Medicare PPO | Admitting: Physical Therapy

## 2021-07-27 ENCOUNTER — Other Ambulatory Visit: Payer: Self-pay

## 2021-07-27 DIAGNOSIS — M546 Pain in thoracic spine: Secondary | ICD-10-CM

## 2021-07-27 DIAGNOSIS — M5414 Radiculopathy, thoracic region: Secondary | ICD-10-CM | POA: Diagnosis not present

## 2021-07-27 NOTE — Therapy (Signed)
[facility] [place], [place], [address] Phone: [phone]   Fax:  [phone]  Physical Therapy Treatment  Patient Details  Name: Megan Boyle MRN: [id number] Date of Birth: Nov 23, 1941 Referring Provider (PT): Reynold Bowen MD   Encounter Date: 07/27/2021   PT End of Session - 07/27/21 1152     Visit Number 5    Number of Visits 12    Date for PT Re-Evaluation 08/24/21    PT Start Time 1030    PT Stop Time 1126    PT Time Calculation (min) 56 min    Activity Tolerance Patient tolerated treatment well    Behavior During Therapy [facility] for tasks assessed/performed             Past Medical History:  Diagnosis Date   Chest pain    Diverticulosis of colon (without mention of hemorrhage)     Past Surgical History:  Procedure Laterality Date   ABDOMINAL HYSTERECTOMY     APPENDECTOMY     CHOLECYSTECTOMY     OPEN REDUCTION INTERNAL FIXATION (ORIF) DISTAL RADIAL FRACTURE Left 03/31/2015   Procedure: OPEN REDUCTION INTERNAL FIXATION (ORIF) LEFT DISTAL RADIAL FRACTURE;  Surgeon: Daryll Brod, MD;  Location: [place];  Service: Orthopedics;  Laterality: Left;   ORIF ANKLE FRACTURE Right 11/25/2019   Procedure: OPEN REDUCTION INTERNAL FIXATION (ORIF) RIGHT ANKLE FRACTURE;  Surgeon: Shona Needles, MD;  Location: [place];  Service: Orthopedics;  Laterality: Right;   VESICOVAGINAL FISTULA CLOSURE W/ TAH      There were no vitals filed for this visit.   Subjective Assessment - 07/27/21 1153     Subjective COVID-19 screen performed prior to patient entering clinic.  Doing great.    Pertinent History right pilon fracture ORIF 11/24/2020; osteoporosis, left wrist ORIF.    Patient Stated Goals Get out of pain.    Currently in Pain? Yes    Pain Score 1     Pain Location Back    Pain Orientation Right;Left    Pain Type Acute pain    Pain Onset More than a month ago                                [facility] Adult PT Treatment/Exercise - 07/27/21 0001       Electrical Stimulation   Electrical Stimulation Location Bilateral mid thoracic region.    Electrical Stimulation Action IFC at 80-150 Hz.    Electrical Stimulation Parameters 40% scan x 20 minutes.    Electrical Stimulation Goals Pain;Tone      Ultrasound   Ultrasound Location Bil miud-thoracic region.    Ultrasound Parameters Combo e'stim/US at 1.50 W/CM2 x 12 minutes.      Manual Therapy   Manual Therapy Soft tissue mobilization    Soft tissue mobilization STW/M to patient's bilateral UT's amd mid-thoracic region x 12 minutes .                         PT Long Term Goals - 07/13/21 1205       PT LONG TERM GOAL #1   Title Patient will be independent with HEP    Time 6    Period Weeks    Status New      PT LONG TERM GOAL #2   Title Bilateral active cervical rotation to 60 degrees.    Time 6  Period Weeks    Status New      PT LONG TERM GOAL #3   Title Perform ADL's with pain not > 2/10.    Time 6    Period Weeks    Status New                   Plan - 07/27/21 1157     Clinical Impression Statement Excellent response to treatment.  Less tone in UT's today.    Personal Factors and Comorbidities Comorbidity 1    Examination-Activity Limitations Other    Rehab Potential Excellent    PT Frequency 2x / week    PT Duration 6 weeks    PT Treatment/Interventions ADLs/Self Care Home Management;Cryotherapy;Ultrasound;Therapeutic activities;Therapeutic exercise;Manual techniques;Patient/family education;Passive range of motion;Dry needling    PT Next Visit Plan Combo e'stim/US, STW/M, postural exercises to include resisted scapular retraction.    Consulted and Agree with Plan of Care Patient             Patient will benefit from skilled therapeutic intervention in order to improve the following deficits and impairments:  Decreased activity  tolerance, Pain, Increased muscle spasms, Postural dysfunction, Decreased range of motion  Visit Diagnosis: Pain in thoracic spine     Problem List Patient Active Problem List   Diagnosis Date Noted   Closed fracture of distal end of right tibia 12/02/2019   Closed right pilon fracture, initial encounter 11/24/2019   Fracture of right ankle 11/24/2019   DIVERTICULOSIS, SIGMOID COLON 03/01/2011   CHEST PAIN 03/01/2011    Roldan Laforest, [place] MPT 07/27/2021, 11:58 AM  [facility] [address] [place], [place], [address] Phone: [phone]   Fax:  [phone]  Name: Megan Boyle MRN: [id number] Date of Birth: 1941/08/22

## 2021-07-31 ENCOUNTER — Other Ambulatory Visit: Payer: Self-pay

## 2021-07-31 ENCOUNTER — Encounter: Payer: Self-pay | Admitting: Physical Therapy

## 2021-07-31 ENCOUNTER — Ambulatory Visit: Payer: Medicare PPO | Attending: Endocrinology | Admitting: Physical Therapy

## 2021-07-31 DIAGNOSIS — M546 Pain in thoracic spine: Secondary | ICD-10-CM | POA: Diagnosis present

## 2021-07-31 DIAGNOSIS — M5414 Radiculopathy, thoracic region: Secondary | ICD-10-CM | POA: Diagnosis present

## 2021-07-31 DIAGNOSIS — R293 Abnormal posture: Secondary | ICD-10-CM | POA: Diagnosis present

## 2021-07-31 DIAGNOSIS — M542 Cervicalgia: Secondary | ICD-10-CM | POA: Insufficient documentation

## 2021-07-31 NOTE — Therapy (Signed)
[facility] [place], [place], [address] Phone: [phone]   Fax:  [phone]  Physical Therapy Treatment  Patient Details  Name: Megan Boyle MRN: [id number] Date of Birth: 04-25-1941 Referring Provider (PT): Reynold Bowen MD   Encounter Date: 07/31/2021   PT End of Session - 07/31/21 1121     Visit Number 6    Number of Visits 12    Date for PT Re-Evaluation 08/24/21    PT Start Time [id number]    PT Stop Time 1210    PT Time Calculation (min) 49 min    Activity Tolerance Patient tolerated treatment well    Behavior During Therapy [facility] for tasks assessed/performed             Past Medical History:  Diagnosis Date   Chest pain    Diverticulosis of colon (without mention of hemorrhage)     Past Surgical History:  Procedure Laterality Date   ABDOMINAL HYSTERECTOMY     APPENDECTOMY     CHOLECYSTECTOMY     OPEN REDUCTION INTERNAL FIXATION (ORIF) DISTAL RADIAL FRACTURE Left 03/31/2015   Procedure: OPEN REDUCTION INTERNAL FIXATION (ORIF) LEFT DISTAL RADIAL FRACTURE;  Surgeon: Daryll Brod, MD;  Location: [place];  Service: Orthopedics;  Laterality: Left;   ORIF ANKLE FRACTURE Right 11/25/2019   Procedure: OPEN REDUCTION INTERNAL FIXATION (ORIF) RIGHT ANKLE FRACTURE;  Surgeon: Shona Needles, MD;  Location: [place];  Service: Orthopedics;  Laterality: Right;   VESICOVAGINAL FISTULA CLOSURE W/ TAH      There were no vitals filed for this visit.   Subjective Assessment - 07/31/21 1113     Subjective COVID-19 screen performed prior to patient entering clinic. Reports more upper back pain recently.    Pertinent History right pilon fracture ORIF 11/24/2020; osteoporosis, left wrist ORIF.    Patient Stated Goals Get out of pain.    Currently in Pain? Yes    Pain Score 4     Pain Location Back    Pain Orientation Right;Left;Upper    Pain Descriptors / Indicators Discomfort    Pain Type Chronic pain     Pain Onset More than a month ago    Pain Frequency Constant                [facility] PT Assessment - 07/31/21 0001       Assessment   Medical Diagnosis Back pain.    Referring Provider (PT) Reynold Bowen MD      Restrictions   Weight Bearing Restrictions No                           [facility] Adult PT Treatment/Exercise - 07/31/21 0001       Modalities   Modalities Electrical Stimulation;Moist Heat;Ultrasound      Moist Heat Therapy   Number Minutes Moist Heat 15 Minutes    Moist Heat Location Other (comment)   thoracic spine     Electrical Stimulation   Electrical Stimulation Location B mid-upper thoracic paraspinals    Electrical Stimulation Action IFC    Electrical Stimulation Parameters 80-150 hz x15 min    Electrical Stimulation Goals Pain      Ultrasound   Ultrasound Location B mid-upper thoracic musculature    Ultrasound Parameters Combo 1.2 w/cm2, 100%, 1 mhz x10 min    Ultrasound Goals Pain      Manual Therapy   Manual Therapy Soft tissue mobilization  Soft tissue mobilization STW to B UT, thoracic paraspinals to reduce pain                         PT Long Term Goals - 07/13/21 1205       PT LONG TERM GOAL #1   Title Patient will be independent with HEP    Time 6    Period Weeks    Status New      PT LONG TERM GOAL #2   Title Bilateral active cervical rotation to 60 degrees.    Time 6    Period Weeks    Status New      PT LONG TERM GOAL #3   Title Perform ADL's with pain not > 2/10.    Time 6    Period Weeks    Status New                   Plan - 07/31/21 1221     Clinical Impression Statement Patient presented in clinic with reports of continued upper thoracic muscle pain. Patient reported feeling muscle tightness as well but minimal tone noted. Normal modalities response noted following removal of the modalities. Patient remains very active with guarding, caring for her sister.    Personal Factors  and Comorbidities Comorbidity 1    Comorbidities right pilon fracture ORIF 11/24/2020; osteoporosis, left wrist ORIF.    Examination-Activity Limitations Other    Examination-Participation Restrictions Other    Stability/Clinical Decision Making Stable/Uncomplicated    Rehab Potential Excellent    PT Frequency 2x / week    PT Duration 6 weeks    PT Treatment/Interventions ADLs/Self Care Home Management;Cryotherapy;Ultrasound;Therapeutic activities;Therapeutic exercise;Manual techniques;Patient/family education;Passive range of motion;Dry needling    PT Next Visit Plan Combo e'stim/US, STW/M, postural exercises to include resisted scapular retraction.    Consulted and Agree with Plan of Care Patient             Patient will benefit from skilled therapeutic intervention in order to improve the following deficits and impairments:  Decreased activity tolerance, Pain, Increased muscle spasms, Postural dysfunction, Decreased range of motion  Visit Diagnosis: Pain in thoracic spine  Abnormal posture  Radiculopathy, thoracic region     Problem List Patient Active Problem List   Diagnosis Date Noted   Closed fracture of distal end of right tibia 12/02/2019   Closed right pilon fracture, initial encounter 11/24/2019   Fracture of right ankle 11/24/2019   DIVERTICULOSIS, SIGMOID COLON 03/01/2011   CHEST PAIN 03/01/2011    Standley Brooking, PTA 07/31/2021, 12:33 PM  [facility] [place], [place], [address] Phone: [phone]   Fax:  [phone]  Name: Megan Boyle MRN: [id number] Date of Birth: September 07, 1941

## 2021-08-02 ENCOUNTER — Ambulatory Visit: Payer: Medicare PPO | Admitting: Physical Therapy

## 2021-08-02 ENCOUNTER — Other Ambulatory Visit: Payer: Self-pay

## 2021-08-02 DIAGNOSIS — M546 Pain in thoracic spine: Secondary | ICD-10-CM | POA: Diagnosis not present

## 2021-08-02 DIAGNOSIS — M542 Cervicalgia: Secondary | ICD-10-CM

## 2021-08-02 NOTE — Patient Instructions (Signed)
[facility]   DRY NEEDLING CONSENT FORM   Trigger point dry needling is a physical therapy approach to treat Myofascial Pain and Dysfunction.  Dry Needling (DN) is a valuable and effective way to deactivate myofascial trigger points (muscle knots/pain). It is skilled intervention that uses a thin filiform needle to penetrate the skin and stimulate underlying myofascial trigger points, muscular, and connective tissues for the management of neuromusculoskeletal pain and movement impairments.  A local twitch response (LTR) will be elicited.  This can sometimes feel like a deep ache in the muscle during the procedure. Multiple trigger points in multiple muscles can be treated during each treatment.  No medication of any kind is injected.   As with any medical treatment and procedure, there are possible adverse events.  While significant adverse events are uncommon, they do sometimes occur and must be considered prior to giving consent.  Dry needling often causes a "post needling soreness".  There can be an increase in pain from a couple of hours to 2-3 days, followed by an improvement in the overall pain state. Any time a needle is used there is a risk of infection.  However, we are using new, sterile, and disposable needles; infections are extremely rare. There is a possibility that you may bleed or bruise.  You may feel tired and some nausea following treatment. There is a rare possibility of a pneumothorax (air in the chest cavity). Allergic reaction to nickel in the stainless steel needle. If a nerve is touched, it may cause paresthesia (a prickling/shock sensation) which is usually brief, but may continue for a couple of days.  Following treatment stay hydrated.  Continue regular activities but not too vigorous initially after treatment for 24-48 hours.  Dry Needling is best when combined with other physical therapy interventions such as strengthening, stretching  and other therapeutic modalities.   PLEASE ANSWER THE FOLLOWING QUESTIONS:  Do you have a lack of sensation?   Y/N  Do you have a phobia or fear of needles  Y/N  Are you pregnant?    Y/N If yes:  How many weeks? __________ Do you have any implanted devices?  Y/N If yes:  Pacemaker/Spinal Cord Stimulator/Deep Brain Stimulator/Insulin Pump/Other: ________________ Do you have any implants?  Y/N If yes: Breast/Facial/Pecs/Buttocks/Calves/Hip  Replacement/ Knee Replacement/Other: _________ Do you take any blood thinners?   Y/N If yes: Coumadin (Warfarin)/Other: ___________________ Do you have a bleeding disorder?   Y/N If yes: What kind: _________________________________ Do you take any immunosuppressants?  Y/N If yes:   What kind: _________________________________ Do you take anti-inflammatories?   Y/N If yes: What kind: Advil/Aspirin/Other: ________________ Have you ever been diagnosed with Scoliosis? Y/N Have you had back surgery?   Y/N If yes:  Laminectomy/Fusion/Other: ___________________   I have read, or had read to me, the above.  I have had the opportunity to ask any questions.  All of my questions have been answered to my satisfaction and I understand the risks involved with dry needling.  I consent to examination and treatment at [facility], including dry needling, of any and all of my involved and affected muscles.     Signature: __________________________________     Date:___________________________________________________     

## 2021-08-02 NOTE — Therapy (Signed)
[facility] [place], [place], [address] Phone: [phone]   Fax:  [phone]  Physical Therapy Treatment  Patient Details  Name: Megan Boyle MRN: [id number] Date of Birth: 02-26-1941 Referring Provider (PT): Reynold Bowen MD   Encounter Date: 08/02/2021   PT End of Session - 08/02/21 1127     Visit Number 7    Number of Visits 12    Date for PT Re-Evaluation 08/24/21    PT Start Time 1030    PT Stop Time 1111    PT Time Calculation (min) 41 min    Activity Tolerance Patient tolerated treatment well             Past Medical History:  Diagnosis Date   Chest pain    Diverticulosis of colon (without mention of hemorrhage)     Past Surgical History:  Procedure Laterality Date   ABDOMINAL HYSTERECTOMY     APPENDECTOMY     CHOLECYSTECTOMY     OPEN REDUCTION INTERNAL FIXATION (ORIF) DISTAL RADIAL FRACTURE Left 03/31/2015   Procedure: OPEN REDUCTION INTERNAL FIXATION (ORIF) LEFT DISTAL RADIAL FRACTURE;  Surgeon: Daryll Brod, MD;  Location: [place];  Service: Orthopedics;  Laterality: Left;   ORIF ANKLE FRACTURE Right 11/25/2019   Procedure: OPEN REDUCTION INTERNAL FIXATION (ORIF) RIGHT ANKLE FRACTURE;  Surgeon: Shona Needles, MD;  Location: [place];  Service: Orthopedics;  Laterality: Right;   VESICOVAGINAL FISTULA CLOSURE W/ TAH      There were no vitals filed for this visit.   Subjective Assessment - 08/02/21 1130     Subjective COVID-19 screen performed prior to patient entering clinic. Patient's CC isl eft sided neck pain today.  She had something removed from the right side of her neck and has been keeping vaseline on it.    Pertinent History right pilon fracture ORIF 11/24/2020; osteoporosis, left wrist ORIF.    Patient Stated Goals Get out of pain.    Currently in Pain? Yes    Pain Location Neck    Pain Orientation Left    Pain Descriptors / Indicators Aching    Pain Onset More than a  month ago                               [facility] Adult PT Treatment/Exercise - 08/02/21 0001       Modalities   Modalities Electrical Stimulation      Electrical Stimulation   Electrical Stimulation Location Left UT.    Electrical Stimulation Action IFC at 80-150 Hz on 40% scan x 10 minutes.      Ultrasound   Ultrasound Location [place] at 1.50 W/CM2 x 12 minutes to patient's left upper trap.      Manual Therapy   Manual Therapy Soft tissue mobilization    Soft tissue mobilization STW/M x 12 minutes over patient entire left upper trap to reduce tone.                         PT Long Term Goals - 07/13/21 1205       PT LONG TERM GOAL #1   Title Patient will be independent with HEP    Time 6    Period Weeks    Status New      PT LONG TERM GOAL #2   Title Bilateral active cervical rotation to 60 degrees.    Time 6  Period Weeks    Status New      PT LONG TERM GOAL #3   Title Perform ADL's with pain not > 2/10.    Time 6    Period Weeks    Status New                   Plan - 08/02/21 1133     Clinical Impression Statement Patient with palpable pain over left UT.  Left mid-back feeling good.  Instruction in chin tucks and cervical extension provided today.    Personal Factors and Comorbidities Comorbidity 1    Comorbidities right pilon fracture ORIF 11/24/2020; osteoporosis, left wrist ORIF.    Examination-Activity Limitations Other    Examination-Participation Restrictions Other    Stability/Clinical Decision Making Stable/Uncomplicated    PT Treatment/Interventions ADLs/Self Care Home Management;Cryotherapy;Ultrasound;Therapeutic activities;Therapeutic exercise;Manual techniques;Patient/family education;Passive range of motion;Dry needling    PT Next Visit Plan Combo e'stim/US, STW/M, postural exercises to include resisted scapular retraction.    Consulted and Agree with Plan of Care Patient             Patient will  benefit from skilled therapeutic intervention in order to improve the following deficits and impairments:     Visit Diagnosis: Cervicalgia     Problem List Patient Active Problem List   Diagnosis Date Noted   Closed fracture of distal end of right tibia 12/02/2019   Closed right pilon fracture, initial encounter 11/24/2019   Fracture of right ankle 11/24/2019   DIVERTICULOSIS, SIGMOID COLON 03/01/2011   CHEST PAIN 03/01/2011    Merinda Victorino, [place] MPT 08/02/2021, 11:34 AM  [facility] [address] [place], [place], [address] Phone: [phone]   Fax:  [phone]  Name: Megan Boyle MRN: [id number] Date of Birth: [birth year]

## 2021-08-07 ENCOUNTER — Ambulatory Visit: Payer: Medicare PPO | Admitting: Physical Therapy

## 2021-08-09 ENCOUNTER — Encounter: Payer: Medicare PPO | Admitting: Physical Therapy

## 2021-09-05 ENCOUNTER — Ambulatory Visit: Payer: Medicare PPO | Admitting: Gastroenterology

## 2021-09-05 ENCOUNTER — Encounter: Payer: Self-pay | Admitting: Gastroenterology

## 2021-09-05 VITALS — BP 138/80 | HR 72 | Ht <= 58 in | Wt 105.2 lb

## 2021-09-05 DIAGNOSIS — K921 Melena: Secondary | ICD-10-CM
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
## 2021-09-05 NOTE — Patient Instructions (Addendum)
It was my pleasure to provide care to you today. Based on our discussion, I am providing you with my recommendations below:  RECOMMENDATION(S):   We will obtain your labs from Dr. Forde Dandy  COLONOSCOPY:   You have been scheduled for a colonoscopy. Please follow written instructions given to you at your visit today.   PREP:   Please pick up your prep supplies at the pharmacy within the next 1-3 days.  INHALERS:   If you use inhalers (even only as needed), please bring them with you on the day of your procedure.  COLONOSCOPY TIPS:  To reduce nausea and dehydration, stay well hydrated for 3-4 days prior to the exam.  To prevent skin/hemorrhoid irritation - prior to wiping, put A&Dointment or vaseline on the toilet paper. Keep a towel or pad on the bed.  BEFORE STARTING YOUR PREP, drink  64oz of clear liquids in the morning. This will help to flush the colon and will ensure you are well hydrated!!!!  NOTE - This is in addition to the fluids required for to complete your prep. Use of a flavored hard candy, such as grape Anise Salvo, can counteract some of the flavor of the prep and may prevent some nausea.   FOLLOW UP:  After your procedure, you will receive a call from my office staff regarding my recommendation for follow up.  BMI:  If you are age 79 or older, your body mass index should be between 23-30. Your Body mass index is 23.6 kg/m. If this is out of the aforementioned range listed, please consider follow up with your Primary Care Provider.  MY CHART:  The [place] [facility] providers would like to encourage you to use [facility] to communicate with providers for non-urgent requests or questions.  Due to long hold times on the telephone, sending your provider a message by [facility] may be a faster and more efficient way to get a response.  Please allow 48 business hours for a response.  Please remember that this is for non-urgent requests.   Thank you for trusting me with your  gastrointestinal care!    Thornton Park, MD, MPH

## 2021-09-05 NOTE — Progress Notes (Signed)
Called Dr. Baldwin Crown office to request most recent labs be faxed to our office. Medical Records states they are faxing them now. Will continue efforts to f/u.

## 2021-09-06 NOTE — Progress Notes (Signed)
SECOND REQUEST:  Again called Dr. Baldwin Crown office to request most recent labs be faxed to our office. LVM with both Medical Records and Referrals requesting labs.

## 2021-09-10 ENCOUNTER — Telehealth: Payer: Self-pay | Admitting: Gastroenterology

## 2021-09-10 NOTE — Telephone Encounter (Signed)
Review of records from Dr. Forde Dandy:  Labs 05/17/21: hemoglobin 14.7, MCV 95.8, RDW 11.9, platelets 179 Labs 05/21/21: Hemosure + Labs 06/08/21: Hemoccult positive X 2

## 2021-09-20 ENCOUNTER — Encounter (HOSPITAL_COMMUNITY): Payer: Medicare PPO

## 2021-09-25 ENCOUNTER — Encounter: Payer: Self-pay | Admitting: Gastroenterology

## 2021-09-25 ENCOUNTER — Other Ambulatory Visit (HOSPITAL_COMMUNITY): Payer: Self-pay | Admitting: *Deleted

## 2021-09-26 ENCOUNTER — Other Ambulatory Visit: Payer: Self-pay

## 2021-09-26 ENCOUNTER — Ambulatory Visit (HOSPITAL_COMMUNITY)
Admission: RE | Admit: 2021-09-26 | Discharge: 2021-09-26 | Disposition: A | Discharge status: Home / Self Care | Payer: Medicare PPO | Source: Ambulatory Visit | Attending: Endocrinology | Admitting: Endocrinology

## 2021-09-26 DIAGNOSIS — M810 Age-related osteoporosis without current pathological fracture: Secondary | ICD-10-CM | POA: Insufficient documentation

## 2021-09-26 MED ORDER — DENOSUMAB 60 MG/ML ~~LOC~~ SOSY
PREFILLED_SYRINGE | SUBCUTANEOUS | Status: AC
  Administered 2021-09-26: 60 mg via SUBCUTANEOUS
  Filled 2021-09-26: qty 1

## 2021-09-26 MED ORDER — DENOSUMAB 60 MG/ML ~~LOC~~ SOSY: 60.0000 mg | PREFILLED_SYRINGE | Freq: Once | SUBCUTANEOUS | Status: AC

## 2021-10-02 ENCOUNTER — Ambulatory Visit (AMBULATORY_SURGERY_CENTER): Payer: Medicare PPO | Admitting: Gastroenterology

## 2021-10-02 ENCOUNTER — Encounter: Payer: Self-pay | Admitting: Gastroenterology

## 2021-10-02 ENCOUNTER — Other Ambulatory Visit: Payer: Self-pay | Admitting: Gastroenterology

## 2021-10-02 ENCOUNTER — Other Ambulatory Visit: Payer: Self-pay

## 2021-10-02 VITALS — BP 140/68 | HR 74 | Temp 96.9°F | Resp 14 | Ht <= 58 in | Wt 105.0 lb

## 2021-10-02 DIAGNOSIS — D126 Benign neoplasm of colon, unspecified: Secondary | ICD-10-CM

## 2021-10-02 DIAGNOSIS — K921 Melena: Secondary | ICD-10-CM

## 2021-10-02 DIAGNOSIS — K5730 Diverticulosis of large intestine without perforation or abscess without bleeding: Secondary | ICD-10-CM | POA: Diagnosis not present

## 2021-10-02 DIAGNOSIS — K635 Polyp of colon: Secondary | ICD-10-CM

## 2021-10-02 DIAGNOSIS — R195 Other fecal abnormalities: Secondary | ICD-10-CM | POA: Diagnosis not present

## 2021-10-02 DIAGNOSIS — D122 Benign neoplasm of ascending colon: Secondary | ICD-10-CM

## 2021-10-02 MED ORDER — SODIUM CHLORIDE 0.9 % IV SOLN: 500.0000 mL | Freq: Once | INTRAVENOUS | Status: DC

## 2021-10-02 NOTE — Patient Instructions (Signed)
Handouts given for diverticulosis, high fiber diet and polyps.  YOU HAD AN ENDOSCOPIC PROCEDURE TODAY AT [facility]:   Refer to the procedure report that was given to you for any specific questions about what was found during the examination.  If the procedure report does not answer your questions, please call your gastroenterologist to clarify.  If you requested that your care partner not be given the details of your procedure findings, then the procedure report has been included in a sealed envelope for you to review at your convenience later.  YOU SHOULD EXPECT: Some feelings of bloating in the abdomen. Passage of more gas than usual.  Walking can help get rid of the air that was put into your GI tract during the procedure and reduce the bloating. If you had a lower endoscopy (such as a colonoscopy or flexible sigmoidoscopy) you may notice spotting of blood in your stool or on the toilet paper. If you underwent a bowel prep for your procedure, you may not have a normal bowel movement for a few days.  Please Note:  You might notice some irritation and congestion in your nose or some drainage.  This is from the oxygen used during your procedure.  There is no need for concern and it should clear up in a day or so.  SYMPTOMS TO REPORT IMMEDIATELY:  Following lower endoscopy (colonoscopy or flexible sigmoidoscopy):  Excessive amounts of blood in the stool  Significant tenderness or worsening of abdominal pains  Swelling of the abdomen that is new, acute  Fever of 100F or higher  For urgent or emergent issues, a gastroenterologist can be reached at any hour by calling [phone]. Do not use MyChart messaging for urgent concerns.    DIET:  We do recommend a small meal at first, but then you may proceed to your regular diet.  Drink plenty of fluids but you should avoid alcoholic beverages for 24 hours.  ACTIVITY:  You should plan to take it easy for the rest of today and you  should NOT DRIVE or use heavy machinery until tomorrow (because of the sedation medicines used during the test).    FOLLOW UP: Our staff will call the number listed on your records 48-72 hours following your procedure to check on you and address any questions or concerns that you may have regarding the information given to you following your procedure. If we do not reach you, we will leave a message.  We will attempt to reach you two times.  During this call, we will ask if you have developed any symptoms of COVID 19. If you develop any symptoms (ie: fever, flu-like symptoms, shortness of breath, cough etc.) before then, please call [phone].  If you test positive for Covid 19 in the 2 weeks post procedure, please call and report this information to [place].    If any biopsies were taken you will be contacted by phone or by letter within the next 1-3 weeks.  Please call us at [phone] if you have not heard about the biopsies in 3 weeks.    SIGNATURES/CONFIDENTIALITY: You and/or your care partner have signed paperwork which will be entered into your electronic medical record.  These signatures attest to the fact that that the information above on your After Visit Summary has been reviewed and is understood.  Full responsibility of the confidentiality of this discharge information lies with you and/or your care-partner.

## 2021-10-02 NOTE — Progress Notes (Signed)
PT taken to PACU. Monitors in place. VSS. Report given to RN. 
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
## 2021-10-02 NOTE — Progress Notes (Signed)
VS by CW  I have reviewed the patient's medical history in detail and updated the computerized patient record.  

## 2021-10-02 NOTE — Progress Notes (Signed)
Called to room to assist during endoscopic procedure.  Patient ID and intended procedure confirmed with present staff. Received instructions for my participation in the procedure from the performing physician.  
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
## 2021-10-04 ENCOUNTER — Telehealth: Payer: Self-pay | Admitting: *Deleted

## 2021-10-04 NOTE — Telephone Encounter (Signed)
  Follow up Call-  Call back number 10/02/2021  Post procedure Call Back phone  # [phone]  Permission to leave phone message Yes  Some recent data might be hidden     Patient questions:  Do you have a fever, pain , or abdominal swelling? Yes.   Pain Score  1 *- pt c/o some slight soreness in lower abdomen. RN states that pressure may have been applied during the procedure that could have caused the soreness. Pt states she does remember the MD stating that it was difficult to move through her colon d/t narrowness. Pt states soreness is tolerable. RN instructed pt to call back if her discomfort worsens.   Have you tolerated food without any problems? Yes.    Have you been able to return to your normal activities? Yes.    Do you have any questions about your discharge instructions: Diet   No. Medications  No. Follow up visit  No.  Do you have questions or concerns about your Care? No.  Actions: * If pain score is 4 or above: No action needed, pain <4.  Have you developed a fever since your procedure? no  2.   Have you had an respiratory symptoms (SOB or cough) since your procedure? no  3.   Have you tested positive for COVID 19 since your procedure no  4.   Have you had any family members/close contacts diagnosed with the COVID 19 since your procedure?  no   If yes to any of these questions please route to Joylene John, RN and Joella Prince, RN

## 2021-10-08 ENCOUNTER — Encounter: Payer: Self-pay | Admitting: Gastroenterology

## 2021-10-09 ENCOUNTER — Telehealth: Payer: Self-pay | Admitting: Gastroenterology

## 2021-10-09 ENCOUNTER — Other Ambulatory Visit: Payer: Self-pay

## 2021-10-09 DIAGNOSIS — D649 Anemia, unspecified: Secondary | ICD-10-CM

## 2021-10-09 NOTE — Telephone Encounter (Signed)
Refer to lab results.  

## 2021-10-09 NOTE — Telephone Encounter (Signed)
Patient returned your call for lab results.

## 2021-11-02 ENCOUNTER — Other Ambulatory Visit (INDEPENDENT_AMBULATORY_CARE_PROVIDER_SITE_OTHER): Payer: Medicare PPO

## 2021-11-02 DIAGNOSIS — D649 Anemia, unspecified: Secondary | ICD-10-CM | POA: Diagnosis not present

## 2021-11-02 LAB — CBC
HCT: 43 % (ref 36.0–46.0)
Hemoglobin: 14.3 g/dL (ref 12.0–15.0)
MCHC: 33.2 g/dL (ref 30.0–36.0)
MCV: 92.9 fl (ref 78.0–100.0)
Platelets: 189 10*3/uL (ref 150.0–400.0)
RBC: 4.64 Mil/uL (ref 3.87–5.11)
RDW: 13.4 % (ref 11.5–15.5)
WBC: 6.2 10*3/uL (ref 4.0–10.5)

## 2021-11-07 ENCOUNTER — Ambulatory Visit (INDEPENDENT_AMBULATORY_CARE_PROVIDER_SITE_OTHER): Payer: Medicare PPO | Admitting: Gastroenterology

## 2021-11-07 ENCOUNTER — Other Ambulatory Visit (INDEPENDENT_AMBULATORY_CARE_PROVIDER_SITE_OTHER): Payer: Medicare PPO

## 2021-11-07 ENCOUNTER — Encounter: Payer: Self-pay | Admitting: Gastroenterology

## 2021-11-07 VITALS — BP 122/82 | HR 75 | Ht <= 58 in | Wt 104.5 lb

## 2021-11-07 DIAGNOSIS — K921 Melena: Secondary | ICD-10-CM | POA: Diagnosis not present

## 2021-11-07 DIAGNOSIS — R1030 Lower abdominal pain, unspecified: Secondary | ICD-10-CM

## 2021-11-07 DIAGNOSIS — R102 Pelvic and perineal pain: Secondary | ICD-10-CM

## 2021-11-07 LAB — COMPREHENSIVE METABOLIC PANEL
ALT: 13 U/L (ref 0–35)
AST: 18 U/L (ref 0–37)
Albumin: 4.6 g/dL (ref 3.5–5.2)
Alkaline Phosphatase: 78 U/L (ref 39–117)
BUN: 25 mg/dL — ABNORMAL HIGH (ref 6–23)
CO2: 26 mEq/L (ref 19–32)
Calcium: 9.3 mg/dL (ref 8.4–10.5)
Chloride: 104 mEq/L (ref 96–112)
Creatinine, Ser: 1.04 mg/dL (ref 0.40–1.20)
GFR: 51.01 mL/min — ABNORMAL LOW (ref >60.00)
Glucose, Bld: 94 mg/dL (ref 70–99)
Potassium: 4.5 mEq/L (ref 3.5–5.1)
Sodium: 137 mEq/L (ref 135–145)
Total Bilirubin: 0.5 mg/dL (ref 0.2–1.2)
Total Protein: 7.3 g/dL (ref 6.0–8.3)

## 2021-11-07 NOTE — Patient Instructions (Signed)
If you are age 79 or older, your body mass index should be between 23-30. Your Body mass index is 23.43 kg/m. If this is out of the aforementioned range listed, please consider follow up with your Primary Care Provider.  If you are age 58 or younger, your body mass index should be between 19-25. Your Body mass index is 23.43 kg/m. If this is out of the aformentioned range listed, please consider follow up with your Primary Care Provider.   Your provider has requested that you go to the basement level for lab work before leaving today. Press "B" on the elevator. The lab is located at the first door on the left as you exit the elevator.  You will receive a call from [facility] Scheduling to schedule your CT of the abdomen and pelvis.  Due to recent changes in healthcare laws, you may see the results of your imaging and laboratory studies on MyChart before your provider has had a chance to review them.  We understand that in some cases there may be results that are confusing or concerning to you. Not all laboratory results come back in the same time frame and the provider may be waiting for multiple results in order to interpret others.  Please give [place] 48 hours in order for your provider to thoroughly review all the results before contacting the office for clarification of your results.    Thank you for choosing me and [facility].
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
## 2021-11-21 ENCOUNTER — Telehealth: Payer: Self-pay | Admitting: Gastroenterology

## 2021-11-21 NOTE — Telephone Encounter (Signed)
Pt came to the office stating that nobody has called her yet from radiology to schedule her CT scan. It has been two weeks. Pt claimed that she called the office on 11/17/21 and was told that we would call her back. There is nothing documented on the chart regarding that call. Pls call pt at both numbers listed on her account [phone].

## 2021-11-21 NOTE — Telephone Encounter (Signed)
Called [facility] after waiting for 15 minutes with no response on an IM to schedule a CT for the patient. Dorothea Ogle stated he would call the patient and schedule her.

## 2021-11-21 NOTE — Telephone Encounter (Signed)
Sent IM to [facility] scheduling to set up this patient for a ct. Abdomen and pelvis.

## 2021-11-27 NOTE — Telephone Encounter (Signed)
Patient scheduled 12/12/2021 for CT of abd

## 2021-12-12 ENCOUNTER — Encounter (HOSPITAL_COMMUNITY): Payer: Self-pay

## 2021-12-12 ENCOUNTER — Ambulatory Visit (HOSPITAL_COMMUNITY)
Admission: RE | Admit: 2021-12-12 | Discharge: 2021-12-12 | Disposition: A | Discharge status: Home / Self Care | Payer: Medicare PPO | Source: Ambulatory Visit | Attending: Gastroenterology | Admitting: Gastroenterology

## 2021-12-12 ENCOUNTER — Other Ambulatory Visit: Payer: Self-pay

## 2021-12-12 DIAGNOSIS — K921 Melena: Secondary | ICD-10-CM | POA: Insufficient documentation

## 2021-12-12 DIAGNOSIS — R1030 Lower abdominal pain, unspecified: Secondary | ICD-10-CM | POA: Diagnosis present

## 2021-12-12 DIAGNOSIS — R102 Pelvic and perineal pain: Secondary | ICD-10-CM

## 2021-12-12 LAB — POCT I-STAT CREATININE: Creatinine, Ser: 1 mg/dL (ref 0.44–1.00)

## 2021-12-12 MED ORDER — IOHEXOL 350 MG/ML SOLN
75.0000 mL | Freq: Once | INTRAVENOUS | Status: AC | PRN
  Administered 2021-12-12: 75 mL via INTRAVENOUS

## 2021-12-12 MED ORDER — SODIUM CHLORIDE (PF) 0.9 % IJ SOLN
INTRAMUSCULAR | Status: AC
  Filled 2021-12-12: qty 50

## 2021-12-14 ENCOUNTER — Other Ambulatory Visit: Payer: Self-pay

## 2021-12-14 DIAGNOSIS — R935 Abnormal findings on diagnostic imaging of other abdominal regions, including retroperitoneum: Secondary | ICD-10-CM

## 2021-12-14 DIAGNOSIS — R932 Abnormal findings on diagnostic imaging of liver and biliary tract: Secondary | ICD-10-CM

## 2021-12-14 DIAGNOSIS — R1030 Lower abdominal pain, unspecified: Secondary | ICD-10-CM

## 2022-03-05 ENCOUNTER — Encounter: Payer: Self-pay | Admitting: Gastroenterology

## 2022-03-26 ENCOUNTER — Other Ambulatory Visit (HOSPITAL_COMMUNITY): Payer: Self-pay | Admitting: *Deleted

## 2022-03-27 ENCOUNTER — Other Ambulatory Visit: Payer: Self-pay

## 2022-03-27 ENCOUNTER — Ambulatory Visit (HOSPITAL_COMMUNITY)
Admission: RE | Admit: 2022-03-27 | Discharge: 2022-03-27 | Disposition: A | Discharge status: Home / Self Care | Payer: Medicare PPO | Source: Ambulatory Visit | Attending: Endocrinology | Admitting: Endocrinology

## 2022-03-27 DIAGNOSIS — M810 Age-related osteoporosis without current pathological fracture: Secondary | ICD-10-CM | POA: Insufficient documentation

## 2022-03-27 MED ORDER — DENOSUMAB 60 MG/ML ~~LOC~~ SOSY
PREFILLED_SYRINGE | SUBCUTANEOUS | Status: AC
  Filled 2022-03-27: qty 1

## 2022-03-27 MED ORDER — DENOSUMAB 60 MG/ML ~~LOC~~ SOSY
60.0000 mg | PREFILLED_SYRINGE | Freq: Once | SUBCUTANEOUS | Status: AC
  Administered 2022-03-27: 60 mg via SUBCUTANEOUS

## 2022-03-28 ENCOUNTER — Telehealth: Payer: Self-pay

## 2022-03-28 NOTE — Telephone Encounter (Signed)
Numerous attempts have been made by BOTH by phone and by sending letter by our office and by [facility] Scheduling re: scheduling pt for repeat MRI/MRCP with labs needed prior to exam to eval renal function. Dr. Tarri Glenn made aware of our failed attempts. Following response received from Dr. Tarri Glenn: ? ?Thornton Park, MD  Aleatha Borer, LPN: Thanks for mailing the letter.  We will wait to hear from the patient. Thank you.  ? ?As stated above, no further efforts will be made by our office. Pt is aware of Dr. Tarri Glenn request/recommendation. Will allow pt to proceed with scheduling on her own as per letter. ?

## 2022-05-01 ENCOUNTER — Ambulatory Visit: Payer: Medicare PPO | Admitting: Gastroenterology

## 2022-05-01 ENCOUNTER — Encounter: Payer: Self-pay | Admitting: Gastroenterology

## 2022-05-01 VITALS — BP 128/80 | HR 70 | Ht <= 58 in | Wt 107.0 lb

## 2022-05-01 DIAGNOSIS — R1030 Lower abdominal pain, unspecified: Secondary | ICD-10-CM

## 2022-05-01 DIAGNOSIS — R933 Abnormal findings on diagnostic imaging of other parts of digestive tract: Secondary | ICD-10-CM

## 2022-05-01 NOTE — Patient Instructions (Signed)
It was my pleasure to provide care to you today. Based on our discussion, I am providing you with my recommendations below: ? ? ?FOLLOW UP: ? ?I would like for you to follow up with me as needed. Please call the office at [phone] to schedule your appointment. ? ?BMI: ? ?If you are age 80 or older, your body mass index should be between 23-30. Your Body mass index is 23.99 kg/m?Marland Kitchen If this is out of the aforementioned range listed, please consider follow up with your Primary Care Provider. ? ?If you are age 43 or younger, your body mass index should be between 19-25. Your Body mass index is 23.99 kg/m?Marland Kitchen If this is out of the aformentioned range listed, please consider follow up with your Primary Care Provider.  ? ?MY CHART: ? ?The [facility] providers would like to encourage you to use [facility] to communicate with providers for non-urgent requests or questions.  Due to long hold times on the telephone, sending your provider a message by [facility] may be a faster and more efficient way to get a response.  Please allow 48 business hours for a response.  Please remember that this is for non-urgent requests.  ? ?Thank you for trusting me with your gastrointestinal care!   ? ?Thornton Park, MD, MPH ? ?
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
## 2022-05-02 ENCOUNTER — Encounter: Payer: Self-pay | Admitting: Gastroenterology

## 2022-05-29 ENCOUNTER — Other Ambulatory Visit: Payer: Self-pay | Admitting: Endocrinology

## 2022-05-29 DIAGNOSIS — Z1231 Encounter for screening mammogram for malignant neoplasm of breast: Secondary | ICD-10-CM

## 2022-06-08 ENCOUNTER — Telehealth: Payer: Self-pay

## 2022-06-08 NOTE — Telephone Encounter (Signed)
Noted. Thanks.

## 2022-06-08 NOTE — Telephone Encounter (Signed)
Spoke with patient to remind her to set up MRI as previously discussed at [facility]. She declined, and said that she would call to schedule if she changes her mind.

## 2022-06-14 ENCOUNTER — Ambulatory Visit
Admission: RE | Admit: 2022-06-14 | Discharge: 2022-06-14 | Disposition: A | Discharge status: Home / Self Care | Payer: Medicare PPO | Source: Ambulatory Visit | Attending: Endocrinology | Admitting: Endocrinology

## 2022-06-14 DIAGNOSIS — Z1231 Encounter for screening mammogram for malignant neoplasm of breast: Secondary | ICD-10-CM

## 2022-09-11 DIAGNOSIS — M25551 Pain in right hip: Secondary | ICD-10-CM | POA: Diagnosis not present

## 2022-09-11 DIAGNOSIS — M79671 Pain in right foot: Secondary | ICD-10-CM | POA: Diagnosis not present

## 2022-10-01 ENCOUNTER — Encounter (HOSPITAL_COMMUNITY): Payer: Medicare PPO

## 2022-10-03 ENCOUNTER — Other Ambulatory Visit (HOSPITAL_COMMUNITY): Payer: Self-pay

## 2022-10-05 ENCOUNTER — Ambulatory Visit (HOSPITAL_COMMUNITY)
Admission: RE | Admit: 2022-10-05 | Discharge: 2022-10-05 | Disposition: A | Discharge status: Home / Self Care | Payer: Medicare PPO | Source: Ambulatory Visit | Attending: Endocrinology | Admitting: Endocrinology

## 2022-10-05 DIAGNOSIS — M810 Age-related osteoporosis without current pathological fracture: Secondary | ICD-10-CM | POA: Insufficient documentation

## 2022-10-05 MED ORDER — DENOSUMAB 60 MG/ML ~~LOC~~ SOSY
PREFILLED_SYRINGE | SUBCUTANEOUS | Status: AC
  Filled 2022-10-05: qty 1

## 2022-10-05 MED ORDER — DENOSUMAB 60 MG/ML ~~LOC~~ SOSY
60.0000 mg | PREFILLED_SYRINGE | Freq: Once | SUBCUTANEOUS | Status: AC
  Administered 2022-10-05: 60 mg via SUBCUTANEOUS

## 2022-10-12 DIAGNOSIS — H43813 Vitreous degeneration, bilateral: Secondary | ICD-10-CM | POA: Diagnosis not present

## 2022-10-12 DIAGNOSIS — H59031 Cystoid macular edema following cataract surgery, right eye: Secondary | ICD-10-CM | POA: Diagnosis not present

## 2022-10-16 DIAGNOSIS — S40869A Insect bite (nonvenomous) of unspecified upper arm, initial encounter: Secondary | ICD-10-CM | POA: Diagnosis not present

## 2022-10-16 DIAGNOSIS — L509 Urticaria, unspecified: Secondary | ICD-10-CM | POA: Diagnosis not present

## 2022-11-07 DIAGNOSIS — H35371 Puckering of macula, right eye: Secondary | ICD-10-CM | POA: Diagnosis not present

## 2023-01-15 DIAGNOSIS — M25552 Pain in left hip: Secondary | ICD-10-CM | POA: Diagnosis not present

## 2023-01-29 DIAGNOSIS — H35351 Cystoid macular degeneration, right eye: Secondary | ICD-10-CM | POA: Diagnosis not present

## 2023-01-29 DIAGNOSIS — Z961 Presence of intraocular lens: Secondary | ICD-10-CM | POA: Diagnosis not present

## 2023-01-29 DIAGNOSIS — H52203 Unspecified astigmatism, bilateral: Secondary | ICD-10-CM | POA: Diagnosis not present

## 2023-04-09 ENCOUNTER — Other Ambulatory Visit (HOSPITAL_COMMUNITY): Payer: Self-pay

## 2023-04-10 ENCOUNTER — Ambulatory Visit (HOSPITAL_COMMUNITY)
Admission: RE | Admit: 2023-04-10 | Discharge: 2023-04-10 | Disposition: A | Discharge status: Home / Self Care | Payer: Medicare PPO | Source: Ambulatory Visit | Attending: Endocrinology | Admitting: Endocrinology

## 2023-04-10 DIAGNOSIS — M810 Age-related osteoporosis without current pathological fracture: Secondary | ICD-10-CM | POA: Insufficient documentation

## 2023-04-10 MED ORDER — DENOSUMAB 60 MG/ML ~~LOC~~ SOSY
PREFILLED_SYRINGE | SUBCUTANEOUS | Status: AC
  Filled 2023-04-10: qty 1

## 2023-04-10 MED ORDER — DENOSUMAB 60 MG/ML ~~LOC~~ SOSY
60.0000 mg | PREFILLED_SYRINGE | Freq: Once | SUBCUTANEOUS | Status: AC
  Administered 2023-04-10: 60 mg via SUBCUTANEOUS

## 2023-05-28 DIAGNOSIS — Z85828 Personal history of other malignant neoplasm of skin: Secondary | ICD-10-CM | POA: Diagnosis not present

## 2023-05-28 DIAGNOSIS — L821 Other seborrheic keratosis: Secondary | ICD-10-CM | POA: Diagnosis not present

## 2023-05-28 DIAGNOSIS — L820 Inflamed seborrheic keratosis: Secondary | ICD-10-CM | POA: Diagnosis not present

## 2023-05-28 DIAGNOSIS — D1801 Hemangioma of skin and subcutaneous tissue: Secondary | ICD-10-CM | POA: Diagnosis not present

## 2023-05-28 DIAGNOSIS — C44519 Basal cell carcinoma of skin of other part of trunk: Secondary | ICD-10-CM | POA: Diagnosis not present

## 2023-05-29 ENCOUNTER — Other Ambulatory Visit: Payer: Self-pay | Admitting: Endocrinology

## 2023-05-29 DIAGNOSIS — Z1231 Encounter for screening mammogram for malignant neoplasm of breast: Secondary | ICD-10-CM

## 2023-05-30 DIAGNOSIS — E559 Vitamin D deficiency, unspecified: Secondary | ICD-10-CM | POA: Diagnosis not present

## 2023-05-30 DIAGNOSIS — I10 Essential (primary) hypertension: Secondary | ICD-10-CM | POA: Diagnosis not present

## 2023-05-30 DIAGNOSIS — E785 Hyperlipidemia, unspecified: Secondary | ICD-10-CM | POA: Diagnosis not present

## 2023-06-06 DIAGNOSIS — Z1339 Encounter for screening examination for other mental health and behavioral disorders: Secondary | ICD-10-CM | POA: Diagnosis not present

## 2023-06-06 DIAGNOSIS — I129 Hypertensive chronic kidney disease with stage 1 through stage 4 chronic kidney disease, or unspecified chronic kidney disease: Secondary | ICD-10-CM | POA: Diagnosis not present

## 2023-06-06 DIAGNOSIS — N1831 Chronic kidney disease, stage 3a: Secondary | ICD-10-CM | POA: Diagnosis not present

## 2023-06-06 DIAGNOSIS — R82998 Other abnormal findings in urine: Secondary | ICD-10-CM | POA: Diagnosis not present

## 2023-06-06 DIAGNOSIS — E785 Hyperlipidemia, unspecified: Secondary | ICD-10-CM | POA: Diagnosis not present

## 2023-06-06 DIAGNOSIS — I10 Essential (primary) hypertension: Secondary | ICD-10-CM | POA: Diagnosis not present

## 2023-06-06 DIAGNOSIS — I700 Atherosclerosis of aorta: Secondary | ICD-10-CM | POA: Diagnosis not present

## 2023-06-06 DIAGNOSIS — Z0000 Encounter for general adult medical examination without abnormal findings: Secondary | ICD-10-CM | POA: Diagnosis not present

## 2023-06-06 DIAGNOSIS — M810 Age-related osteoporosis without current pathological fracture: Secondary | ICD-10-CM | POA: Diagnosis not present

## 2023-06-06 DIAGNOSIS — Z1331 Encounter for screening for depression: Secondary | ICD-10-CM | POA: Diagnosis not present

## 2023-06-20 ENCOUNTER — Ambulatory Visit
Admission: RE | Admit: 2023-06-20 | Discharge: 2023-06-20 | Disposition: A | Discharge status: Home / Self Care | Payer: Medicare PPO | Source: Ambulatory Visit | Attending: Endocrinology | Admitting: Endocrinology

## 2023-06-20 DIAGNOSIS — Z1231 Encounter for screening mammogram for malignant neoplasm of breast: Secondary | ICD-10-CM

## 2023-08-15 DIAGNOSIS — H04123 Dry eye syndrome of bilateral lacrimal glands: Secondary | ICD-10-CM | POA: Diagnosis not present

## 2023-08-27 DIAGNOSIS — E785 Hyperlipidemia, unspecified: Secondary | ICD-10-CM | POA: Diagnosis not present

## 2023-08-27 DIAGNOSIS — F419 Anxiety disorder, unspecified: Secondary | ICD-10-CM | POA: Diagnosis not present

## 2023-08-27 DIAGNOSIS — M810 Age-related osteoporosis without current pathological fracture: Secondary | ICD-10-CM | POA: Diagnosis not present

## 2023-08-27 DIAGNOSIS — I700 Atherosclerosis of aorta: Secondary | ICD-10-CM | POA: Diagnosis not present

## 2023-08-27 DIAGNOSIS — N1831 Chronic kidney disease, stage 3a: Secondary | ICD-10-CM | POA: Diagnosis not present

## 2023-08-27 DIAGNOSIS — K635 Polyp of colon: Secondary | ICD-10-CM | POA: Diagnosis not present

## 2023-08-27 DIAGNOSIS — I129 Hypertensive chronic kidney disease with stage 1 through stage 4 chronic kidney disease, or unspecified chronic kidney disease: Secondary | ICD-10-CM | POA: Diagnosis not present

## 2023-08-27 DIAGNOSIS — R932 Abnormal findings on diagnostic imaging of liver and biliary tract: Secondary | ICD-10-CM | POA: Diagnosis not present

## 2023-10-28 ENCOUNTER — Encounter (HOSPITAL_COMMUNITY): Payer: Medicare PPO

## 2023-10-29 ENCOUNTER — Encounter (HOSPITAL_COMMUNITY): Payer: Medicare PPO

## 2023-11-01 ENCOUNTER — Other Ambulatory Visit (HOSPITAL_COMMUNITY): Payer: Self-pay

## 2023-11-04 ENCOUNTER — Ambulatory Visit (HOSPITAL_COMMUNITY)
Admission: RE | Admit: 2023-11-04 | Discharge: 2023-11-04 | Disposition: A | Discharge status: Home / Self Care | Payer: Medicare PPO | Source: Ambulatory Visit | Attending: Endocrinology | Admitting: Endocrinology

## 2023-11-04 DIAGNOSIS — M810 Age-related osteoporosis without current pathological fracture: Secondary | ICD-10-CM | POA: Diagnosis not present

## 2023-11-04 MED ORDER — DENOSUMAB 60 MG/ML ~~LOC~~ SOSY
PREFILLED_SYRINGE | SUBCUTANEOUS | Status: AC
  Administered 2023-11-04: 60 mg via SUBCUTANEOUS
  Filled 2023-11-04: qty 1

## 2023-11-04 MED ORDER — DENOSUMAB 60 MG/ML ~~LOC~~ SOSY: 60.0000 mg | PREFILLED_SYRINGE | Freq: Once | SUBCUTANEOUS | Status: AC

## 2023-12-23 DIAGNOSIS — I10 Essential (primary) hypertension: Secondary | ICD-10-CM | POA: Diagnosis not present

## 2023-12-23 DIAGNOSIS — R82998 Other abnormal findings in urine: Secondary | ICD-10-CM | POA: Diagnosis not present

## 2024-01-08 DIAGNOSIS — L304 Erythema intertrigo: Secondary | ICD-10-CM | POA: Diagnosis not present

## 2024-01-08 DIAGNOSIS — N390 Urinary tract infection, site not specified: Secondary | ICD-10-CM | POA: Diagnosis not present

## 2024-01-08 DIAGNOSIS — N760 Acute vaginitis: Secondary | ICD-10-CM | POA: Diagnosis not present

## 2024-01-21 DIAGNOSIS — H524 Presbyopia: Secondary | ICD-10-CM | POA: Diagnosis not present

## 2024-01-21 DIAGNOSIS — Z961 Presence of intraocular lens: Secondary | ICD-10-CM | POA: Diagnosis not present

## 2024-02-18 DIAGNOSIS — R200 Anesthesia of skin: Secondary | ICD-10-CM | POA: Diagnosis not present

## 2024-05-14 ENCOUNTER — Other Ambulatory Visit (HOSPITAL_COMMUNITY): Payer: Self-pay | Admitting: *Deleted

## 2024-05-15 ENCOUNTER — Ambulatory Visit (HOSPITAL_COMMUNITY)
Admission: RE | Admit: 2024-05-15 | Discharge: 2024-05-15 | Disposition: A | Discharge status: Home / Self Care | Source: Ambulatory Visit | Attending: Endocrinology | Admitting: Endocrinology

## 2024-05-15 DIAGNOSIS — M810 Age-related osteoporosis without current pathological fracture: Secondary | ICD-10-CM | POA: Diagnosis not present

## 2024-05-15 MED ORDER — DENOSUMAB 60 MG/ML ~~LOC~~ SOSY
PREFILLED_SYRINGE | SUBCUTANEOUS | Status: AC
  Filled 2024-05-15: qty 1

## 2024-05-15 MED ORDER — DENOSUMAB 60 MG/ML ~~LOC~~ SOSY
60.0000 mg | PREFILLED_SYRINGE | Freq: Once | SUBCUTANEOUS | Status: AC
  Administered 2024-05-15: 60 mg via SUBCUTANEOUS

## 2024-06-09 ENCOUNTER — Other Ambulatory Visit: Payer: Self-pay | Admitting: Endocrinology

## 2024-06-09 DIAGNOSIS — Z1231 Encounter for screening mammogram for malignant neoplasm of breast: Secondary | ICD-10-CM

## 2024-06-10 DIAGNOSIS — I129 Hypertensive chronic kidney disease with stage 1 through stage 4 chronic kidney disease, or unspecified chronic kidney disease: Secondary | ICD-10-CM | POA: Diagnosis not present

## 2024-06-10 DIAGNOSIS — E559 Vitamin D deficiency, unspecified: Secondary | ICD-10-CM | POA: Diagnosis not present

## 2024-06-10 DIAGNOSIS — E785 Hyperlipidemia, unspecified: Secondary | ICD-10-CM | POA: Diagnosis not present

## 2024-06-10 DIAGNOSIS — N1831 Chronic kidney disease, stage 3a: Secondary | ICD-10-CM | POA: Diagnosis not present

## 2024-06-17 DIAGNOSIS — Z1331 Encounter for screening for depression: Secondary | ICD-10-CM | POA: Diagnosis not present

## 2024-06-17 DIAGNOSIS — I129 Hypertensive chronic kidney disease with stage 1 through stage 4 chronic kidney disease, or unspecified chronic kidney disease: Secondary | ICD-10-CM | POA: Diagnosis not present

## 2024-06-17 DIAGNOSIS — E785 Hyperlipidemia, unspecified: Secondary | ICD-10-CM | POA: Diagnosis not present

## 2024-06-17 DIAGNOSIS — Z1339 Encounter for screening examination for other mental health and behavioral disorders: Secondary | ICD-10-CM | POA: Diagnosis not present

## 2024-06-17 DIAGNOSIS — M810 Age-related osteoporosis without current pathological fracture: Secondary | ICD-10-CM | POA: Diagnosis not present

## 2024-06-17 DIAGNOSIS — Z0000 Encounter for general adult medical examination without abnormal findings: Secondary | ICD-10-CM | POA: Diagnosis not present

## 2024-06-17 DIAGNOSIS — N1831 Chronic kidney disease, stage 3a: Secondary | ICD-10-CM | POA: Diagnosis not present

## 2024-06-17 DIAGNOSIS — K635 Polyp of colon: Secondary | ICD-10-CM | POA: Diagnosis not present

## 2024-06-17 DIAGNOSIS — I700 Atherosclerosis of aorta: Secondary | ICD-10-CM | POA: Diagnosis not present

## 2024-06-17 DIAGNOSIS — R932 Abnormal findings on diagnostic imaging of liver and biliary tract: Secondary | ICD-10-CM | POA: Diagnosis not present

## 2024-06-17 DIAGNOSIS — R82998 Other abnormal findings in urine: Secondary | ICD-10-CM | POA: Diagnosis not present

## 2024-06-23 ENCOUNTER — Ambulatory Visit
Admission: RE | Admit: 2024-06-23 | Discharge: 2024-06-23 | Disposition: A | Discharge status: Home / Self Care | Source: Ambulatory Visit | Attending: Endocrinology | Admitting: Endocrinology

## 2024-06-23 DIAGNOSIS — Z1231 Encounter for screening mammogram for malignant neoplasm of breast: Secondary | ICD-10-CM

## 2024-07-14 DIAGNOSIS — L570 Actinic keratosis: Secondary | ICD-10-CM | POA: Diagnosis not present

## 2024-07-14 DIAGNOSIS — D1801 Hemangioma of skin and subcutaneous tissue: Secondary | ICD-10-CM | POA: Diagnosis not present

## 2024-07-14 DIAGNOSIS — Z85828 Personal history of other malignant neoplasm of skin: Secondary | ICD-10-CM | POA: Diagnosis not present

## 2024-07-14 DIAGNOSIS — D2371 Other benign neoplasm of skin of right lower limb, including hip: Secondary | ICD-10-CM | POA: Diagnosis not present

## 2024-07-14 DIAGNOSIS — L821 Other seborrheic keratosis: Secondary | ICD-10-CM | POA: Diagnosis not present

## 2024-08-07 DIAGNOSIS — H10503 Unspecified blepharoconjunctivitis, bilateral: Secondary | ICD-10-CM | POA: Diagnosis not present

## 2024-08-18 DIAGNOSIS — H1033 Unspecified acute conjunctivitis, bilateral: Secondary | ICD-10-CM | POA: Diagnosis not present

## 2024-09-09 DIAGNOSIS — H01005 Unspecified blepharitis left lower eyelid: Secondary | ICD-10-CM | POA: Diagnosis not present

## 2024-09-09 DIAGNOSIS — H01002 Unspecified blepharitis right lower eyelid: Secondary | ICD-10-CM | POA: Diagnosis not present

## 2024-09-09 DIAGNOSIS — H04223 Epiphora due to insufficient drainage, bilateral lacrimal glands: Secondary | ICD-10-CM | POA: Diagnosis not present

## 2024-12-29 ENCOUNTER — Other Ambulatory Visit (HOSPITAL_COMMUNITY): Payer: Self-pay | Admitting: Endocrinology

## 2024-12-29 ENCOUNTER — Telehealth (HOSPITAL_COMMUNITY): Payer: Self-pay | Admitting: Pharmacy Technician

## 2024-12-29 DIAGNOSIS — M810 Age-related osteoporosis without current pathological fracture: Secondary | ICD-10-CM | POA: Insufficient documentation

## 2024-12-29 NOTE — Telephone Encounter (Signed)
 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

## 2025-01-08 ENCOUNTER — Ambulatory Visit (HOSPITAL_COMMUNITY)
Admission: RE | Admit: 2025-01-08 | Discharge: 2025-01-08 | Disposition: A | Source: Ambulatory Visit | Attending: Endocrinology | Admitting: Endocrinology

## 2025-01-08 VITALS — BP 147/63 | HR 90 | Temp 98.0°F | Resp 16

## 2025-01-08 DIAGNOSIS — M810 Age-related osteoporosis without current pathological fracture: Secondary | ICD-10-CM | POA: Diagnosis present

## 2025-01-08 MED ORDER — DENOSUMAB 60 MG/ML ~~LOC~~ SOSY
PREFILLED_SYRINGE | SUBCUTANEOUS | Status: AC
  Filled 2025-01-08: qty 1

## 2025-01-08 MED ORDER — DENOSUMAB 60 MG/ML ~~LOC~~ SOSY
60.0000 mg | PREFILLED_SYRINGE | Freq: Once | SUBCUTANEOUS | Status: AC
  Administered 2025-01-08: 60 mg via SUBCUTANEOUS

## 2025-07-09 ENCOUNTER — Encounter (HOSPITAL_COMMUNITY)
